# Patient Record
Sex: Female | Born: 1980
Health system: Southern US, Community
[De-identification: ages and names within clinical notes are randomized; demographics above are authoritative.]

## PROBLEM LIST (undated history)

## (undated) DIAGNOSIS — F419 Anxiety disorder, unspecified: Secondary | ICD-10-CM

## (undated) HISTORY — DX: Anxiety disorder, unspecified: F41.9

---
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
## 2002-02-16 ENCOUNTER — Inpatient Hospital Stay (HOSPITAL_COMMUNITY): Admission: AD | Admit: 2002-02-16 | Discharge: 2002-02-16 | Payer: Self-pay | Admitting: Family Medicine

## 2002-02-17 ENCOUNTER — Encounter: Payer: Self-pay | Admitting: Family Medicine

## 2002-03-03 ENCOUNTER — Other Ambulatory Visit: Admission: RE | Admit: 2002-03-03 | Discharge: 2002-03-03 | Payer: Self-pay | Admitting: Obstetrics and Gynecology

## 2002-07-14 ENCOUNTER — Encounter: Admission: RE | Admit: 2002-07-14 | Discharge: 2002-07-14 | Payer: Self-pay | Admitting: Obstetrics and Gynecology

## 2002-09-22 ENCOUNTER — Inpatient Hospital Stay (HOSPITAL_COMMUNITY): Admission: AD | Admit: 2002-09-22 | Discharge: 2002-09-22 | Payer: Self-pay | Admitting: Obstetrics and Gynecology

## 2002-09-23 ENCOUNTER — Inpatient Hospital Stay (HOSPITAL_COMMUNITY): Admission: AD | Admit: 2002-09-23 | Discharge: 2002-09-23 | Payer: Self-pay | Admitting: Obstetrics and Gynecology

## 2002-09-25 ENCOUNTER — Encounter: Payer: Self-pay | Admitting: Obstetrics and Gynecology

## 2002-09-25 ENCOUNTER — Inpatient Hospital Stay (HOSPITAL_COMMUNITY): Admission: AD | Admit: 2002-09-25 | Discharge: 2002-09-28 | Payer: Self-pay | Admitting: Obstetrics and Gynecology

## 2004-04-12 ENCOUNTER — Emergency Department (HOSPITAL_COMMUNITY): Admission: EM | Admit: 2004-04-12 | Discharge: 2004-04-12 | Payer: Self-pay | Admitting: Emergency Medicine

## 2013-02-25 ENCOUNTER — Ambulatory Visit (INDEPENDENT_AMBULATORY_CARE_PROVIDER_SITE_OTHER): Payer: Managed Care, Other (non HMO) | Admitting: Family Medicine

## 2013-02-25 VITALS — BP 110/68 | HR 109 | Temp 100.6°F | Resp 16 | Ht 65.0 in | Wt 155.0 lb

## 2013-02-25 DIAGNOSIS — R6889 Other general symptoms and signs: Secondary | ICD-10-CM

## 2013-02-25 DIAGNOSIS — R05 Cough: Secondary | ICD-10-CM

## 2013-02-25 DIAGNOSIS — R51 Headache: Secondary | ICD-10-CM

## 2013-02-25 DIAGNOSIS — R059 Cough, unspecified: Secondary | ICD-10-CM

## 2013-02-25 LAB — POCT INFLUENZA A/B
Influenza A, POC: NEGATIVE
Influenza B, POC: NEGATIVE

## 2013-02-25 MED ORDER — HYDROCODONE-HOMATROPINE 5-1.5 MG/5ML PO SYRP: 5.0000 mL | ORAL_SOLUTION | ORAL | Status: DC | PRN

## 2013-02-25 MED ORDER — OSELTAMIVIR PHOSPHATE 75 MG PO CAPS: 75.0000 mg | ORAL_CAPSULE | Freq: Two times a day (BID) | ORAL | Status: DC

## 2013-02-25 NOTE — Patient Instructions (Addendum)
Drink plenty of fluids  Get sufficient rest  Take Tamiflu one twice daily for 5 days  Use cough syrup 1 teaspoon every 4-6 hours as needed  Take an antihistamine decongestant such as Claritin-D, Allegra-D, or Zyrtec-D if the head is very congested.  Take tylenol or ibuprofen or aleve as needed for headache and body aches  Return if worse

## 2013-02-25 NOTE — Progress Notes (Signed)
Subjective: Yesterday evening patient developed chills, body aches, coughing, nasal congestion, generalized malaise. She took some NyQuil. Today she feels miserable. She is a little bit nauseous but no vomiting. Did not get a flu shot this year. Does have a headache  Objective: Ill-appearing lady curled up on the exam table. TMs normal. Throat clear. Neck supple without nodes. Chest clear. Heart regular without murmurs. He is a little bit tachycardic.  Assessment: Flulike illness  Plan: Fluids   Results for orders placed in visit on 02/25/13  POCT INFLUENZA A/B      Result Value Ref Range   Influenza A, POC Negative     Influenza B, POC Negative

## 2013-10-20 ENCOUNTER — Ambulatory Visit (INDEPENDENT_AMBULATORY_CARE_PROVIDER_SITE_OTHER): Payer: Managed Care, Other (non HMO) | Admitting: Family Medicine

## 2013-10-20 VITALS — BP 108/58 | HR 82 | Temp 98.6°F | Resp 16 | Ht 65.75 in | Wt 167.0 lb

## 2013-10-20 DIAGNOSIS — R05 Cough: Secondary | ICD-10-CM

## 2013-10-20 DIAGNOSIS — J029 Acute pharyngitis, unspecified: Secondary | ICD-10-CM

## 2013-10-20 DIAGNOSIS — B349 Viral infection, unspecified: Secondary | ICD-10-CM

## 2013-10-20 DIAGNOSIS — H109 Unspecified conjunctivitis: Secondary | ICD-10-CM

## 2013-10-20 DIAGNOSIS — R059 Cough, unspecified: Secondary | ICD-10-CM

## 2013-10-20 LAB — POCT RAPID STREP A (OFFICE): Rapid Strep A Screen: NEGATIVE

## 2013-10-20 MED ORDER — MAGIC MOUTHWASH W/LIDOCAINE: 5.0000 mL | Freq: Four times a day (QID) | ORAL | Status: DC | PRN

## 2013-10-20 MED ORDER — HYDROCODONE-HOMATROPINE 5-1.5 MG/5ML PO SYRP: 5.0000 mL | ORAL_SOLUTION | Freq: Three times a day (TID) | ORAL | Status: DC | PRN

## 2013-10-20 MED ORDER — AMOXICILLIN 500 MG PO CAPS: 500.0000 mg | ORAL_CAPSULE | Freq: Two times a day (BID) | ORAL | Status: DC

## 2013-10-20 NOTE — Progress Notes (Signed)
Chief Complaint:  Chief Complaint  Patient presents with  . Sore Throat    x 5 days  . Eye Burn    left since today     HPI: Terri Fry is a 33 y.o. female who is here for  A 5 day history of  Cough and sore throat. She can't sleep because of the sore throat. She is here today because it has been a long time and she is not better and then today she started having left eye redness. She noticed blisters on the back of her throat. 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 She denies fevers or chills or rash on the plasms of her hands or soles of her feet. She is not having any sinus sxs or ear pain.  No eye injury, + itching and burning  Past Medical History  Diagnosis Date  . Anxiety    History reviewed. No pertinent past surgical history. History   Social History  . Marital Status: Married    Spouse Name: N/A    Number of Children: N/A  . Years of Education: N/A   Social History Main Topics  . Smoking status: Never Smoker   . Smokeless tobacco: None  . Alcohol Use: None  . Drug Use: None  . Sexual Activity: None   Other Topics Concern  . None   Social History Narrative  . None   History reviewed. No pertinent family history. No Known Allergies Prior to Admission medications   Medication Sig Start Date End Date Taking? Authorizing Provider  escitalopram (LEXAPRO) 5 MG tablet Take 5 mg by mouth daily.   Yes Historical Provider, MD  HYDROcodone-homatropine (HYCODAN) 5-1.5 MG/5ML syrup Take 5 mLs by mouth every 4 (four) hours as needed for cough. 02/25/13   Peyton Najjar, MD  oseltamivir (TAMIFLU) 75 MG capsule Take 1 capsule (75 mg total) by mouth 2 (two) times daily. 02/25/13   Peyton Najjar, MD     ROS: The patient denies fevers, chills, night sweats, unintentional weight loss, chest pain, palpitations, wheezing, dyspnea on exertion, nausea, vomiting, abdominal pain, dysuria, hematuria, melena, numbness, weakness, or tingling.   All other systems have been  reviewed and were otherwise negative with the exception of those mentioned in the HPI and as above.    PHYSICAL EXAM: Filed Vitals:   10/20/13 2054  BP: 108/58  Pulse: 82  Temp: 98.6 F (37 C)  Resp: 16   Filed Vitals:   10/20/13 2054  Height: 5' 5.75" (1.67 m)  Weight: 167 lb (75.751 kg)   Body mass index is 27.16 kg/(m^2).  General: Alert, no acute distress HEENT:  Normocephalic, atraumatic, oropharynx patent. EOMI, PERRLA + erythematous vesicles. No exudates. TM nl, nontender sinuses Cardiovascular:  Regular rate and rhythm, no rubs murmurs or gallops.  No Carotid bruits, radial pulse intact. No pedal edema.  Respiratory: Clear to auscultation bilaterally.  No wheezes, rales, or rhonchi.  No cyanosis, no use of accessory musculature GI: No organomegaly, abdomen is soft and non-tender, positive bowel sounds.  No masses. Skin: No rashes. Neurologic: Facial musculature symmetric. Psychiatric: Patient is appropriate throughout our interaction. Lymphatic: No cervical lymphadenopathy Musculoskeletal: Gait intact.   LABS: Results for orders placed in visit on 10/20/13  POCT RAPID STREP A (OFFICE)      Result Value Ref Range   Rapid Strep A Screen Negative  Negative     EKG/XRAY:   Primary read interpreted by Dr. Conley Rolls at [facility].  ASSESSMENT/PLAN: Encounter Diagnoses  Name Primary?  . Acute pharyngitis, unspecified pharyngitis type Yes  . Conjunctivitis of left eye   . Viral illness   . Cough    Hycodan, magic mouthwash with lidocaine Rx  For amox givne but advise to wait until strep cx returns Zaditor for left eye conjunctival irritation  Gross sideeffects, risk and benefits, and alternatives of medications d/w patient. Patient is aware that all medications have potential sideeffects and we are unable to predict every sideeffect or drug-drug interaction that may occur.  Hamilton CapriLE, Laurenashley Viar PHUONG, DO 10/20/2013 10:43 PM

## 2013-10-20 NOTE — Patient Instructions (Signed)
Conjunctivitis °Conjunctivitis is commonly called "pink eye." Conjunctivitis can be caused by bacterial or viral infection, allergies, or injuries. There is usually redness of the lining of the eye, itching, discomfort, and sometimes discharge. There may be deposits of matter along the eyelids. A viral infection usually causes a watery discharge, while a bacterial infection causes a yellowish, thick discharge. Pink eye is very contagious and spreads by direct contact. °You may be given antibiotic eyedrops as part of your treatment. Before using your eye medicine, remove all drainage from the eye by washing gently with warm water and cotton balls. Continue to use the medication until you have awakened 2 mornings in a row without discharge from the eye. Do not rub your eye. This increases the irritation and helps spread infection. Use separate towels from other household members. Wash your hands with soap and water before and after touching your eyes. Use cold compresses to reduce pain and sunglasses to relieve irritation from light. Do not wear contact lenses or wear eye makeup until the infection is gone. °SEEK MEDICAL CARE IF:  °· Your symptoms are not better after 3 days of treatment. °· You have increased pain or trouble seeing. °· The outer eyelids become very red or swollen. °Document Released: 02/09/2004 Document Revised: 03/26/2011 Document Reviewed: 01/01/2005 °ExitCare® Patient Information ©2015 [employer]. This information is not intended to replace advice given to you by your health care provider. Make sure you discuss any questions you have with your health care provider. °Pharyngitis °Pharyngitis is redness, pain, and swelling (inflammation) of your pharynx.  °CAUSES  °Pharyngitis is usually caused by infection. Most of the time, these infections are from viruses (viral) and are part of a cold. However, sometimes pharyngitis is caused by bacteria (bacterial). Pharyngitis can also be caused by  allergies. Viral pharyngitis may be spread from person to person by coughing, sneezing, and personal items or utensils (cups, forks, spoons, toothbrushes). Bacterial pharyngitis may be spread from person to person by more intimate contact, such as kissing.  °SIGNS AND SYMPTOMS  °Symptoms of pharyngitis include:   °· Sore throat.   °· Tiredness (fatigue).   °· Low-grade fever.   °· Headache. °· Joint pain and muscle aches. °· Skin rashes. °· Swollen lymph nodes. °· Plaque-like film on throat or tonsils (often seen with bacterial pharyngitis). °DIAGNOSIS  °Your health care provider will ask you questions about your illness and your symptoms. Your medical history, along with a physical exam, is often all that is needed to diagnose pharyngitis. Sometimes, a rapid strep test is done. Other lab tests may also be done, depending on the suspected cause.  °TREATMENT  °Viral pharyngitis will usually get better in 3-4 days without the use of medicine. Bacterial pharyngitis is treated with medicines that kill germs (antibiotics).  °HOME CARE INSTRUCTIONS  °· Drink enough water and fluids to keep your urine clear or pale yellow.   °· Only take over-the-counter or prescription medicines as directed by your health care provider:   °¨ If you are prescribed antibiotics, make sure you finish them even if you start to feel better.   °¨ Do not take aspirin.   °· Get lots of rest.   °· Gargle with 8 oz of salt water (½ tsp of salt per 1 qt of water) as often as every 1-2 hours to soothe your throat.   °· Throat lozenges (if you are not at risk for choking) or sprays may be used to soothe your throat. °SEEK MEDICAL CARE IF:  °· You have large, tender lumps in   your neck. °· You have a rash. °· You cough up green, yellow-brown, or bloody spit. °SEEK IMMEDIATE MEDICAL CARE IF:  °· Your neck becomes stiff. °· You drool or are unable to swallow liquids. °· You vomit or are unable to keep medicines or liquids down. °· You have severe pain that  does not go away with the use of recommended medicines. °· You have trouble breathing (not caused by a stuffy nose). °MAKE SURE YOU:  °· Understand these instructions. °· Will watch your condition. °· Will get help right away if you are not doing well or get worse. °Document Released: 01/01/2005 Document Revised: 10/22/2012 Document Reviewed: 09/08/2012 °ExitCare® Patient Information ©2015 Exit[employer], LLC. This information is not intended to replace advice given to you by your health care provider. Make sure you discuss any questions you have with your health care provider. ° °

## 2013-10-23 LAB — CULTURE, GROUP A STREP: Organism ID, Bacteria: NORMAL

## 2013-11-06 ENCOUNTER — Telehealth: Payer: Self-pay | Admitting: Genetic Counselor

## 2013-11-06 NOTE — Telephone Encounter (Signed)
LEFT MESSAGE FOR PATIENT TO RETURN CALL TO SCHEDULE GENETIC APPT.  °

## 2013-11-06 NOTE — Telephone Encounter (Signed)
S/W PATIENT AND GAVE GENETIC APPT FOR 11/04 @ 1 Mariea ClontsW/KAREN Lowell GuitarPOWELL

## 2013-11-17 ENCOUNTER — Telehealth: Payer: Self-pay | Admitting: Genetic Counselor

## 2013-11-17 NOTE — Telephone Encounter (Signed)
Spoke with Terri Fry and confirmed that her mother has been tested for BRCA mutations.  She will bring report with her to the appointment. 

## 2013-11-18 ENCOUNTER — Other Ambulatory Visit: Payer: Managed Care, Other (non HMO)

## 2013-11-18 ENCOUNTER — Encounter: Payer: Self-pay | Admitting: Genetic Counselor

## 2013-11-18 ENCOUNTER — Ambulatory Visit (HOSPITAL_BASED_OUTPATIENT_CLINIC_OR_DEPARTMENT_OTHER): Payer: Managed Care, Other (non HMO) | Admitting: Genetic Counselor

## 2013-11-18 DIAGNOSIS — Z808 Family history of malignant neoplasm of other organs or systems: Secondary | ICD-10-CM

## 2013-11-18 DIAGNOSIS — Z8041 Family history of malignant neoplasm of ovary: Secondary | ICD-10-CM

## 2013-11-18 DIAGNOSIS — Z8049 Family history of malignant neoplasm of other genital organs: Secondary | ICD-10-CM

## 2013-11-18 DIAGNOSIS — Z800 Family history of malignant neoplasm of digestive organs: Secondary | ICD-10-CM

## 2013-11-18 DIAGNOSIS — Z8481 Family history of carrier of genetic disease: Secondary | ICD-10-CM

## 2013-11-18 DIAGNOSIS — Z315 Encounter for genetic counseling: Secondary | ICD-10-CM

## 2013-11-18 DIAGNOSIS — Z803 Family history of malignant neoplasm of breast: Secondary | ICD-10-CM

## 2013-11-18 NOTE — Progress Notes (Addendum)
Dr.  Lynnette Caffey, Jinny Blossom, DO requested a consultation for genetic counseling and risk assessment for Terri Fry, a 33 y.o. female, for discussion of her family history of melanoma and breast, ovarian, uterine and colon cancer as well as a known BRCA2 mutation found in her mother in 10/2013.  She presents to clinic today to discuss the possibility of a genetic predisposition to cancer, and to further clarify her risks, as well as her family members' risks for cancer.   HISTORY OF PRESENT ILLNESS: Terri Fry is a 33 y.o. female with no personal history of cancer.  She reporting having a 2 year history of breast leaking.  She states that it is milky in color and she notices in in her bra as well as when she is in the shower.  Her last pregnancy was 19 years ago.  She does SBE and has not noted any changes.  She has not had a mammogram, but is interested in getting one.  This GC suggested contacting her physician to let them know of the BRCA2 mutation in the family along with her breast leaking and ask if they would order a mammogram.  Past Medical History  Diagnosis Date  . Anxiety     History reviewed. No pertinent past surgical history.  History   Social History  . Marital Status: Married    Spouse Name: N/A    Number of Children: N/A  . Years of Education: N/A   Social History Main Topics  . Smoking status: Never Smoker   . Smokeless tobacco: None  . Alcohol Use: Yes  . Drug Use: None  . Sexual Activity: None   Other Topics Concern  . None   Social History Narrative    REPRODUCTIVE HISTORY AND PERSONAL RISK ASSESSMENT FACTORS: Menarche was at age 30.   premenopausal Uterus Intact: yes Ovaries Intact: yes G2P2A0, first live birth at age 40  She has not previously undergone treatment for infertility.   Oral Contraceptive use: 2 years   She has not used HRT in the past.    FAMILY HISTORY:  We obtained a detailed, 4-generation family history.  Significant diagnoses are  listed below: Family History  Problem Relation Age of Onset  . Endometrial cancer Mother 47    stage IV clear cell  . Colon cancer Maternal Grandmother 30  . Endometrial cancer Maternal Aunt 61  . Melanoma Other   . Breast cancer Other     dx in her 73s  . Ovarian cancer Other     dx in her 71s  . Stomach cancer Other    The patient's mother was tested through [facility] in October 2015, and was found to have a BRCA2 mutation.  Specifically [id number].    Patient's maternal ancestors are of [place] and [place] descent, and paternal ancestors are of [place] descent. There is no reported Ashkenazi Jewish ancestry. There is no known consanguinity.  GENETIC COUNSELING ASSESSMENT: Terri Fry is a 33 y.o. female with a family history of a known BRCA2 mutation as well as history of cancer including breast, ovarian, stomach, melanoma, and endometrial cancers which somewhat suggestive of a hereditary cancer syndrome and predisposition to cancer. We, therefore, discussed and recommended the following at today's visit.   DISCUSSION: We reviewed the characteristics, features and inheritance patterns of hereditary cancer syndromes. We also discussed genetic testing, including the appropriate family members to test, the process of testing, insurance coverage and turn-around-time for results.   We discussed that Jhoanna has  a 50% chance of having inherited the BRCA2 mutation found in her mother.  Genetic testing will allow [place] to know whether she does carry a mutation and therefore need to change her medical management.  We discussed that medical management changes include yearly mammograms and breast MRI's, and consideration of tamoxifen or oral contraceptive chemoprevention.  Alternatively, to decrease the risk for breast and ovarian cancer to the greatest extent, mastectomy and oophorectomy could be performed.   We discussed that we need a copy of her mother's actual report, as we have  the clinic note with the mutation.  The report allows [place] to determine that there was not a typo in the transcription of the letter, and provides consent from the original patient that their results may be used as a positive control.  We will draw blood today and get her sample preauthorized while we try to get a copy of the actual report from [facility].   PLAN: After considering the risks, benefits, and limitations, Terri Fry provided informed consent to pursue genetic testing and the blood sample will be sent to [facility] for analysis of the single site BRCA2 mutation. We discussed the implications of a positive, negative and/ or variant of uncertain significance genetic test result. Results should be available within approximately 10 days to 2 weeks' time, at which point they will be disclosed by telephone to Terri Fry, as will any additional recommendations warranted by these results. Terri Fry will receive a summary of her genetic counseling visit and a copy of her results once available. This information will also be available in Epic. We encouraged Terri Fry to remain in contact with cancer genetics annually so that we can continuously update the family history and inform her of any changes in cancer genetics and testing that may be of benefit for her family. Terri Fry's questions were answered to her satisfaction today. Our contact information was provided should additional questions or concerns arise.  The patient was seen for a total of 45 minutes, greater than 50% of which was spent face-to-face counseling.  This note will also be sent to the referring provider via the electronic medical record. The patient will be supplied with a summary of this genetic counseling discussion as well as educational information on the discussed hereditary cancer syndromes following the conclusion of their visit.    _______________________________________________________________________ For Office Staff:  Number of people involved in session: 1 Was an Intern/ student involved with case: no

## 2013-12-07 ENCOUNTER — Telehealth: Payer: Self-pay | Admitting: Genetic Counselor

## 2013-12-07 ENCOUNTER — Encounter: Payer: Self-pay | Admitting: Genetic Counselor

## 2013-12-07 DIAGNOSIS — Z1501 Genetic susceptibility to malignant neoplasm of breast: Secondary | ICD-10-CM | POA: Insufficient documentation

## 2013-12-07 DIAGNOSIS — Z1509 Genetic susceptibility to other malignant neoplasm: Secondary | ICD-10-CM

## 2013-12-07 NOTE — Telephone Encounter (Signed)
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  Scheduled a results appointment for Tuesday, 11/24 at 1 PM.

## 2013-12-08 ENCOUNTER — Telehealth: Payer: Self-pay | Admitting: *Deleted

## 2013-12-08 ENCOUNTER — Encounter: Payer: Self-pay | Admitting: Genetic Counselor

## 2013-12-08 ENCOUNTER — Telehealth: Payer: Self-pay | Admitting: Genetic Counselor

## 2013-12-08 ENCOUNTER — Ambulatory Visit (HOSPITAL_BASED_OUTPATIENT_CLINIC_OR_DEPARTMENT_OTHER): Payer: Managed Care, Other (non HMO) | Admitting: Genetic Counselor

## 2013-12-08 DIAGNOSIS — Z1501 Genetic susceptibility to malignant neoplasm of breast: Secondary | ICD-10-CM

## 2013-12-08 DIAGNOSIS — Z1509 Genetic susceptibility to other malignant neoplasm: Principal | ICD-10-CM

## 2013-12-08 DIAGNOSIS — Z315 Encounter for genetic counseling: Secondary | ICD-10-CM

## 2013-12-08 NOTE — Telephone Encounter (Signed)
Called Dr. Linda Hedges' office and spoke with Nurse Dorothea Ogle.  Explained that patient was found to be BRCA2 positive, and reported at original appointment that she has had a 2 year history of breast leakage.  I spoke with Dr. Lindi Adie about this, who feels that we should get her into the high risk breast clinic for evaluation and then a referral for mammogram and breast MRI.  Asked if Dr. Lynnette Caffey wanted to follow her for this or if she would want [place] to set up appointment in high risk clinic.  Dr. Lynnette Caffey is out this week, so Dorothea Ogle asked that we set up the appointment and get things started.

## 2013-12-08 NOTE — Telephone Encounter (Signed)
Received request from Maylon CosKaren Powell who spoke with Dr. Pamelia HoitGudena to schedule pt w/ him next week.  Called pt and confirmed 12/15/13 appt.  Placed a note for an intake form to be given to pt at time of check in.  Emailed Clydie BraunKaren to make her aware.

## 2013-12-08 NOTE — Progress Notes (Signed)
HPI: [name] was previously seen in the [facility] due to a known family mutation in [place] and family history of cancer and concerns regarding a hereditary predisposition to cancer. Please refer to our prior cancer genetics clinic note for more information regarding [name]'s medical, social and family histories, and our assessment and recommendations, at the time. [name]'s recent genetic test results were disclosed to her, as were recommendations warranted by these results. These results and recommendations are discussed in more detail below.  GENETIC TEST RESULTS: At the time of [name]'s visit, we recommended she pursue genetic testing of the BRCA2 targeted testing. This test, which included targeted sequencing of the BRCA2 gene.  The report date is 12/04/13.  Testing was performed at [facility]. Genetic testing revealed a deleterious BRCA2 mutation. The test report has been scanned into EPIC and is located under the Media tab.   The role of the BRCA2 gene in the body is to help regulate cell growth.  A mutation in a BRCA gene can allow cells to grow unchecked and thus the presence of this genetic alteration gives an explanation for why your mother and aunt developed uterine cancer, although uterine cancer is not a typical cancer seen in BRCA families.    MEDICAL MANAGEMENT, RISK-REDUCTION AND SURVEILLANCE:  Women who have an altered BRCA2 gene have an increased risk for both breast and ovarian cancer.  The good news is that there are ways of reducing one's risk of cancer.    We discussed to reduce the risk for breast cancer, prophylactic mastectomy (removal of both breasts) is the most effective option.  However, for women who choose to keep their breasts, we recommend yearly mammograms, yearly breast MRI, twice-yearly breast exams in a specialized breast center, and monthly self-breast exams.  Ms. Terri Fry reports having a two year  history of breast leakage.  Therefore we recommend getting a mammogram and breast MRI soon.  We will set Ms. Terri Fry up to be seen in the [facility] to obtain more information regarding the above options.   To reduce your risk for ovarian cancer, it is recommend you to have a prophylactic bilateral salpingo-oophorectomy (removal of your ovaries and fallopian tubes) once childbearing is complete.  Ms. Terri Fry has two sons ages 61 and 12.  She stated that she and her husband may consider another child. For women who are planning to have children, screening with CA-125 blood tests and transvaginal ultrasounds can be done twice per year.  However, these tests have not been shown to detect ovarian cancer at an early stage.  We reviewed who else in the family were at risk for having a BRCA2 mutation.  This includes her aunts, sister and children.  At this time one of Ms. Terri Fry aunts was negative for the BRCA2 mutation, and another is waiting for her results to come back.  CANCER SCREENING RECOMMENDATIONS: Ms. Terri Fry should have mammograms and breast MRI yearly.  She has been referred to the high risk breast clinic at [facility] to discuss these options.   RECOMMENDATIONS FOR FAMILY MEMBERS: Ms. Terri Fry's children are at 50% risk to have inherited the mutation. Her children, however, are relatively young and this will not be of any consequence to them for several years. We do not test children because there is no risk to them until they are adults.  It should also be kept in mind that for children, we are bound to know  a great deal more about breast cancer and its prevention in several years' time. We recommend that her children have genetic counseling and testing by the time that they are in their early 32s.    SUPPORT AND RESOURCES: There are two groups, Facing Our Risk (www.facingourrisk.com) and Bright Pink (www.brightpink.org) which some people have found useful.  They  provide opportunities to speak with other individuals from high-risk families.  To locate genetic counselors in other cities, visit the website of the [employer] (ArtistMovie.se) and Secretary/administrator for a Social worker by zip code.  Our contact number was provided. [name]'s questions were answered to her satisfaction, and she knows she is welcome to call us at anytime with additional questions or concerns.   Terri Kayser, MS, [facility] Certified Genetic Counselor [email or web address]

## 2013-12-09 ENCOUNTER — Encounter: Payer: Self-pay | Admitting: Genetic Counselor

## 2013-12-15 ENCOUNTER — Ambulatory Visit: Payer: Managed Care, Other (non HMO)

## 2013-12-15 ENCOUNTER — Telehealth: Payer: Self-pay | Admitting: Hematology and Oncology

## 2013-12-15 ENCOUNTER — Other Ambulatory Visit: Payer: Self-pay | Admitting: Hematology and Oncology

## 2013-12-15 ENCOUNTER — Encounter: Payer: Self-pay | Admitting: Hematology and Oncology

## 2013-12-15 ENCOUNTER — Ambulatory Visit (HOSPITAL_BASED_OUTPATIENT_CLINIC_OR_DEPARTMENT_OTHER): Payer: Managed Care, Other (non HMO) | Admitting: Hematology and Oncology

## 2013-12-15 DIAGNOSIS — Z1509 Genetic susceptibility to other malignant neoplasm: Principal | ICD-10-CM

## 2013-12-15 DIAGNOSIS — Z1501 Genetic susceptibility to malignant neoplasm of breast: Secondary | ICD-10-CM

## 2013-12-15 DIAGNOSIS — Z808 Family history of malignant neoplasm of other organs or systems: Secondary | ICD-10-CM

## 2013-12-15 DIAGNOSIS — N644 Mastodynia: Secondary | ICD-10-CM

## 2013-12-15 DIAGNOSIS — Z803 Family history of malignant neoplasm of breast: Secondary | ICD-10-CM

## 2013-12-15 NOTE — Progress Notes (Signed)
[place] CONSULT NOTE  No care team member to display  CHIEF COMPLAINTS/PURPOSE OF CONSULTATION:  BRCA2 mutation  HISTORY OF PRESENTING ILLNESS:  Terri Fry 33 y.o. female is here because of recent diagnosis of BRCA2 mutation positivity. Her mother was diagnosed with endometrial cancer. Subtype and had a BRCA2 mutation positivity and hence she came by to the genetics clinic to get tested. She was found to be BRCA2 mutation positive and requested an appointment to see [place] to discuss surveillance at high risk breast clinic. Patient reports that recently her left breast appears to be more tender as well as there appears to be some nipple discharge and leakage she is also complaining of lower abdominal pain in the pelvic area. She has not yet seen a gynecologist.  I reviewed her records extensively and collaborated the history with the patient.  In terms of breast cancer risk profile:  She menarched at early age of 55   She had 2 pregnancy, her first child was born at age 54  She as received birth control pills for approximately one year.  She was never exposed to fertility medications or hormone replacement therapy.  She has  family history of Breast/GYN/GI cancer Her mother has endometrial cancer. Grandmother breast cancer and ovarian cancers  MEDICAL HISTORY:  Past Medical History  Diagnosis Date  . Anxiety     SURGICAL HISTORY: No past surgical history on file.  SOCIAL HISTORY: History   Social History  . Marital Status: Married    Spouse Name: Cory Roughen    Number of Children: 2  . Years of Education: N/A   Occupational History  . Not on file.   Social History Main Topics  . Smoking status: Never Smoker   . Smokeless tobacco: Not on file  . Alcohol Use: Yes  . Drug Use: Not on file  . Sexual Activity: Not on file   Other Topics Concern  . Not on file   Social History Narrative    FAMILY HISTORY: Family History  Problem Relation Age of Onset  .  Endometrial cancer Mother 85    stage IV clear cell  . BRCA 1/2 Mother     BRCA2 positive  . Colon cancer Maternal Grandmother 28  . Endometrial cancer Maternal Aunt 61  . Melanoma Other   . Breast cancer Other     dx in her 43s  . Ovarian cancer Other     dx in her 60s  . Stomach cancer Other   . BRCA 1/2 Maternal Aunt     BRCA2 negative    ALLERGIES:  has No Known Allergies.  MEDICATIONS:  Current Outpatient Prescriptions  Medication Sig Dispense Refill  . Alum & Mag Hydroxide-Simeth (MAGIC MOUTHWASH W/LIDOCAINE) SOLN Take 5 mLs by mouth 4 (four) times daily as needed for mouth pain. 120 mL 0  . amoxicillin (AMOXIL) 500 MG capsule Take 1 capsule (500 mg total) by mouth 2 (two) times daily. 20 capsule 0  . escitalopram (LEXAPRO) 5 MG tablet Take 5 mg by mouth daily.    Marland Kitchen HYDROcodone-homatropine (HYCODAN) 5-1.5 MG/5ML syrup Take 5 mLs by mouth every 8 (eight) hours as needed for cough. 120 mL 0   No current facility-administered medications for this visit.    REVIEW OF SYSTEMS:   Constitutional: Denies fevers, chills or abnormal night sweats Eyes: Denies blurriness of vision, double vision or watery eyes Ears, nose, mouth, throat, and face: Denies mucositis or sore throat Respiratory: Denies cough, dyspnea or wheezes  Cardiovascular: Denies palpitation, chest discomfort or lower extremity swelling Gastrointestinal:  Denies nausea, heartburn or change in bowel habits Skin: Denies abnormal skin rashes Lymphatics: Denies new lymphadenopathy or easy bruising Neurological:Denies numbness, tingling or new weaknesses Behavioral/Psych: Mood is stable, no new changes  Breast:  Denies any palpable lumps complaints of nipple discharge in left breast tenderness All other systems were reviewed with the patient and are negative.  PHYSICAL EXAMINATION: ECOG PERFORMANCE STATUS: 1 - Symptomatic but completely ambulatory  Filed Vitals:   12/15/13 1256  BP: 120/71  Pulse: 70  Temp: 98.5  F (36.9 C)  Resp: 18   Filed Weights   12/15/13 1256  Weight: 167 lb 8 oz (75.978 kg)    GENERAL:alert, no distress and comfortable SKIN: skin color, texture, turgor are normal, no rashes or significant lesions EYES: normal, conjunctiva are pink and non-injected, sclera clear OROPHARYNX:no exudate, no erythema and lips, buccal mucosa, and tongue normal  NECK: supple, thyroid normal size, non-tender, without nodularity LYMPH:  no palpable lymphadenopathy in the cervical, axillary or inguinal LUNGS: clear to auscultation and percussion with normal breathing effort HEART: regular rate & rhythm and no murmurs and no lower extremity edema ABDOMEN:abdomen soft, non-tender and normal bowel sounds Musculoskeletal:no cyanosis of digits and no clubbing  PSYCH: alert & oriented x 3 with fluent speech NEURO: no focal motor/sensory deficits BREAST: No palpable nodules in breast. No palpable axillary or supraclavicular lymphadenopathy  LABORATORY DATA:  I have reviewed the data as listed No results found for: WBC, HGB, HCT, MCV, PLT No results found for: NA, K, CL, CO2  RADIOGRAPHIC STUDIES: I have personally reviewed the radiological reports and agreed with the findings in the report.  ASSESSMENT AND PLAN:  BRCA2 positive BRCA2 mutation positive: I discussed with her the BRCA2 mutation positivity increases the risk of breast cancer and ovarian cancer. The incidence of breast cancer is about 45% and that of ovarian cancer is 11-17%. I discussed different ways to do surveillance for breast cancer and I recommended doing annual mammograms and breast MRIs. Patient also complains of tenderness or nipple leakage and discharge and hence we need to urgently order these tests. Regarding ovarian cancer risk reduction, since there is no dilated test that can help [place], I do not recommend testing for CA 125. Given the current symptoms of lower abdominal pain, I recommended obtaining a CT abdomen and pelvis  for further evaluation. I instructed the patient to urgently make an appointment to see gynecologist for pelvic exam and ovarian evaluation. Also did discuss the risks and benefits of oophorectomy. Patient is very concerned about menopausal symptoms of oophorectomy. I discussed with her that a short duration of hormone replacement therapy is not an unreasonable approach.  I discussed different remedies for management of hot flashes and mood changes including Neurontin and Effexor. The patient is already on Lexapro. I discussed with her that Lexapro may help with hot flashes as well.  I would like to see her back in 3 weeks for discussion regarding the tests and talk about additional interventions.     Rulon Eisenmenger, MD 12/15/2013 2:04 PM

## 2013-12-15 NOTE — Progress Notes (Signed)
New patient intake form - chart updated.  Sent to scan.   

## 2013-12-15 NOTE — Addendum Note (Signed)
Addended by: Lorri FrederickFRANKLIN, Montravious Weigelt K on: 12/15/2013 05:46 PM   Modules accepted: Orders, Medications

## 2013-12-15 NOTE — Assessment & Plan Note (Signed)
BRCA2 mutation positive: I discussed with her the BRCA2 mutation positivity increases the risk of breast cancer and ovarian cancer. The incidence of breast cancer is about 45% and that of ovarian cancer is 11-17%. I discussed different ways to do surveillance for breast cancer and I recommended doing annual mammograms and breast MRIs. Patient also complains of tenderness or nipple leakage and discharge and hence we need to urgently order these tests. Regarding ovarian cancer risk reduction, since there is no dilated test that can help us, I do not recommend testing for CA 125. Given the current symptoms of lower abdominal pain, I recommended obtaining a CT abdomen and pelvis for further evaluation. I instructed the patient to urgently make an appointment to see gynecologist for pelvic exam and ovarian evaluation. Also did discuss the risks and benefits of oophorectomy. Patient is very concerned about menopausal symptoms of oophorectomy. I discussed with her that a short duration of hormone replacement therapy is not an unreasonable approach.  I discussed different remedies for management of hot flashes and mood changes including Neurontin and Effexor. The patient is already on Lexapro. I discussed with her that Lexapro may help with hot flashes as well.  I would like to see her back in 3 weeks for discussion regarding the tests and talk about additional interventions. 

## 2013-12-15 NOTE — Telephone Encounter (Signed)
gv pt appt schedule for dec and appts for ct, mammo and mri. pt also given prep for ct. all appts on appt desk.

## 2013-12-17 ENCOUNTER — Ambulatory Visit
Admission: RE | Admit: 2013-12-17 | Discharge: 2013-12-17 | Disposition: A | Discharge status: Home / Self Care | Payer: Managed Care, Other (non HMO) | Source: Ambulatory Visit | Attending: Hematology and Oncology | Admitting: Hematology and Oncology

## 2013-12-17 ENCOUNTER — Encounter: Payer: Self-pay | Admitting: Genetic Counselor

## 2013-12-17 ENCOUNTER — Other Ambulatory Visit: Payer: Self-pay | Admitting: Oncology

## 2013-12-17 DIAGNOSIS — Z1501 Genetic susceptibility to malignant neoplasm of breast: Secondary | ICD-10-CM

## 2013-12-17 DIAGNOSIS — Z1509 Genetic susceptibility to other malignant neoplasm: Principal | ICD-10-CM

## 2013-12-17 DIAGNOSIS — Z1379 Encounter for other screening for genetic and chromosomal anomalies: Secondary | ICD-10-CM | POA: Insufficient documentation

## 2013-12-17 MED ORDER — IOHEXOL 300 MG/ML  SOLN
100.0000 mL | Freq: Once | INTRAMUSCULAR | Status: AC | PRN
  Administered 2013-12-17: 100 mL via INTRAVENOUS

## 2013-12-23 ENCOUNTER — Other Ambulatory Visit: Payer: Self-pay

## 2013-12-23 ENCOUNTER — Other Ambulatory Visit: Payer: Self-pay | Admitting: Hematology and Oncology

## 2013-12-23 DIAGNOSIS — N644 Mastodynia: Secondary | ICD-10-CM

## 2013-12-25 ENCOUNTER — Ambulatory Visit
Admission: RE | Admit: 2013-12-25 | Discharge: 2013-12-25 | Disposition: A | Discharge status: Home / Self Care | Payer: Managed Care, Other (non HMO) | Source: Ambulatory Visit | Attending: Hematology and Oncology | Admitting: Hematology and Oncology

## 2013-12-25 DIAGNOSIS — Z1501 Genetic susceptibility to malignant neoplasm of breast: Secondary | ICD-10-CM

## 2013-12-25 DIAGNOSIS — Z1509 Genetic susceptibility to other malignant neoplasm: Principal | ICD-10-CM

## 2013-12-25 DIAGNOSIS — N644 Mastodynia: Secondary | ICD-10-CM

## 2014-01-05 ENCOUNTER — Telehealth: Payer: Self-pay | Admitting: Hematology and Oncology

## 2014-01-05 ENCOUNTER — Ambulatory Visit (HOSPITAL_BASED_OUTPATIENT_CLINIC_OR_DEPARTMENT_OTHER): Payer: Managed Care, Other (non HMO) | Admitting: Hematology and Oncology

## 2014-01-05 VITALS — BP 123/56 | HR 79 | Temp 98.3°F | Resp 20 | Ht 60.75 in | Wt 172.0 lb

## 2014-01-05 DIAGNOSIS — Z1501 Genetic susceptibility to malignant neoplasm of breast: Secondary | ICD-10-CM

## 2014-01-05 DIAGNOSIS — Z1509 Genetic susceptibility to other malignant neoplasm: Principal | ICD-10-CM

## 2014-01-05 NOTE — Telephone Encounter (Signed)
, °

## 2014-01-05 NOTE — Assessment & Plan Note (Signed)
BRCA2 [id number] mutation:  Breast cancer surveillance: 1. Breast exam 12/15/2013 normal 2. Mammograms and breast MRI 12/25/2013 and 12/28/2013 normal  Ovarian cancer surveillance: 1. GYN evaluations 2. CT abdomen and pelvis 12/18/1998 1510 for lower abdominal pain: Negative for cancer  3. Patient is evaluating different options including oophorectomy and will discuss this with her gynecologist.  Return to clinic once a year with mammograms and MRI breast and follow-up   

## 2014-01-05 NOTE — Progress Notes (Signed)
No care team member to display  DIAGNOSIS: BRCA2 mutation high-risk breast cancer CHIEF COMPLIANT: Follow-up after scans.  INTERVAL HISTORY: Terri Fry is a 33 year old lady with above-mentioned history of BRCA2 mutation she is here for follow-up after undergoing MRIs mammograms and CT of the abdomen and pelvis. She denies any other new complaints or problems.  REVIEW OF SYSTEMS:   Constitutional: Denies fevers, chills or abnormal weight loss Eyes: Denies blurriness of vision Ears, nose, mouth, throat, and face: Denies mucositis or sore throat Respiratory: Denies cough, dyspnea or wheezes Cardiovascular: Denies palpitation, chest discomfort or lower extremity swelling Gastrointestinal:  Denies nausea, heartburn or change in bowel habits Skin: Denies abnormal skin rashes Lymphatics: Denies new lymphadenopathy or easy bruising Neurological:Denies numbness, tingling or new weaknesses Behavioral/Psych: Mood is stable, no new changes  Breast: Small amount of nipple discharge All other systems were reviewed with the patient and are negative.  I have reviewed the past medical history, past surgical history, social history and family history with the patient and they are unchanged from previous note.  ALLERGIES:  has No Known Allergies.  MEDICATIONS:  Current Outpatient Prescriptions  Medication Sig Dispense Refill  . escitalopram (LEXAPRO) 5 MG tablet Take 5 mg by mouth daily.     No current facility-administered medications for this visit.    PHYSICAL EXAMINATION: ECOG PERFORMANCE STATUS: 1 - Symptomatic but completely ambulatory  Filed Vitals:   01/05/14 0834  BP: 123/56  Pulse: 79  Temp: 98.3 F (36.8 C)  Resp: 20   Filed Weights   01/05/14 0834  Weight: 172 lb (78.019 kg)    GENERAL:alert, no distress and comfortable SKIN: skin color, texture, turgor are normal, no rashes or significant lesions EYES: normal, Conjunctiva are pink and non-injected, sclera  clear OROPHARYNX:no exudate, no erythema and lips, buccal mucosa, and tongue normal  NECK: supple, thyroid normal size, non-tender, without nodularity LYMPH:  no palpable lymphadenopathy in the cervical, axillary or inguinal LUNGS: clear to auscultation and percussion with normal breathing effort HEART: regular rate & rhythm and no murmurs and no lower extremity edema ABDOMEN:abdomen soft, non-tender and normal bowel sounds Musculoskeletal:no cyanosis of digits and no clubbing  NEURO: alert & oriented x 3 with fluent speech, no focal motor/sensory deficits  LABORATORY DATA:  I have reviewed the data as listed   Chemistry   No results found for: NA, K, CL, CO2, BUN, CREATININE, GLU No results found for: CALCIUM, ALKPHOS, AST, ALT, BILITOT   RADIOGRAPHIC STUDIES: I have personally reviewed the radiology reports and agreed with their findings. MRI breasts, mammogram breasts, CT abdomen and pelvis were reviewed   ASSESSMENT & PLAN:  BRCA2 positive BRCA2 [id number] mutation:  Breast cancer surveillance: 1. Breast exam 12/15/2013 normal 2. Mammograms and breast MRI 12/25/2013 and 12/28/2013 normal  Ovarian cancer surveillance: 1. GYN evaluations 2. CT abdomen and pelvis 12/18/1998 1510 for lower abdominal pain: Negative for cancer  3. Patient is evaluating different options including oophorectomy and will discuss this with her gynecologist.  Return to clinic once a year with mammograms and MRI breast and follow-up     Orders Placed This Encounter  Procedures  . MR Breast Bilateral W Wo Contrast    Standing Status: Future     Number of Occurrences:      Standing Expiration Date: 03/09/2015    Order Specific Question:  Reason for Exam (SYMPTOM  OR DIAGNOSIS REQUIRED)    Answer:  BRCA 2 mutation    Order Specific Question:  Preferred imaging location?  Answer:  [facility]    Order Specific Question:  Does the patient have a pacemaker or implanted devices?     Answer:  No    Order Specific Question:  What is the patient's sedation requirement?    Answer:  No Sedation   The patient has a good understanding of the overall plan. she agrees with it. She will call with any problems that may develop before her next visit here.   Rulon Eisenmenger, MD 01/05/2014 9:20 AM

## 2014-12-13 ENCOUNTER — Other Ambulatory Visit: Payer: Self-pay

## 2014-12-13 DIAGNOSIS — Z1231 Encounter for screening mammogram for malignant neoplasm of breast: Secondary | ICD-10-CM

## 2015-01-06 ENCOUNTER — Encounter: Payer: Self-pay | Admitting: Hematology and Oncology

## 2015-01-06 ENCOUNTER — Telehealth: Payer: Self-pay | Admitting: Hematology and Oncology

## 2015-01-06 ENCOUNTER — Ambulatory Visit (HOSPITAL_BASED_OUTPATIENT_CLINIC_OR_DEPARTMENT_OTHER): Payer: Managed Care, Other (non HMO) | Admitting: Hematology and Oncology

## 2015-01-06 VITALS — BP 114/70 | HR 87 | Temp 100.0°F | Resp 18 | Wt 159.6 lb

## 2015-01-06 DIAGNOSIS — Z1501 Genetic susceptibility to malignant neoplasm of breast: Secondary | ICD-10-CM

## 2015-01-06 DIAGNOSIS — Z1509 Genetic susceptibility to other malignant neoplasm: Principal | ICD-10-CM

## 2015-01-06 DIAGNOSIS — Z1371 Encounter for nonprocreative screening for genetic disease carrier status: Secondary | ICD-10-CM | POA: Diagnosis not present

## 2015-01-06 NOTE — Assessment & Plan Note (Signed)
BRCA2 [id number] mutation:  Breast cancer surveillance: 1. Breast exam 01/06/2015 normal 2. Mammograms and breast MRI 12/25/2013 and 12/28/2013 normal (2016 scans are scheduled)  Ovarian cancer surveillance: 1. GYN evaluations 2. CT abdomen and pelvis 12/17/2013 for lower abdominal pain: Negative for cancer  3. Patient is evaluating different options including oophorectomy and will discuss this with her gynecologist.  Return to clinic once a year with mammograms and MRI breast and follow-up

## 2015-01-06 NOTE — Telephone Encounter (Signed)
Patient did not call for her mri as we had left her a message to call in 12/2013 for her appointment,we do not have any 2018 schedules open at this time and she is aware   anne

## 2015-01-06 NOTE — Progress Notes (Signed)
Patient Care Team: No Pcp Per Patient as PCP - General (General Practice)  DIAGNOSIS: BRCA2 mutation.  CHIEF COMPLIANT: surveillance for breast cancer  INTERVAL HISTORY: Terri Fry is a 34 year old with above-mentioned history of BRCA2 mutation who is here for annual follow-up. She reports no problems or concerns. She denies any lumps or nodules in the breasts. She is scheduled to undergo mammogram next week but her breast MRI has not been scheduled.  REVIEW OF SYSTEMS:   Constitutional: Denies fevers, chills or abnormal weight loss Eyes: Denies blurriness of vision Ears, nose, mouth, throat, and face: Denies mucositis or sore throat Respiratory: Denies cough, dyspnea or wheezes Cardiovascular: Denies palpitation, chest discomfort Gastrointestinal:  Denies nausea, heartburn or change in bowel habits Skin: Denies abnormal skin rashes Lymphatics: Denies new lymphadenopathy or easy bruising Neurological:Denies numbness, tingling or new weaknesses Behavioral/Psych: Mood is stable, no new changes  Extremities: No lower extremity edema Breast:  denies any pain or lumps or nodules in either breasts All other systems were reviewed with the patient and are negative.  I have reviewed the past medical history, past surgical history, social history and family history with the patient and they are unchanged from previous note.  ALLERGIES:  has No Known Allergies.  MEDICATIONS:  No current outpatient prescriptions on file.   No current facility-administered medications for this visit.    PHYSICAL EXAMINATION: ECOG PERFORMANCE STATUS: 0 - Asymptomatic  Filed Vitals:   01/06/15 1008  BP: 114/70  Pulse: 87  Temp: 100 F (37.8 C)  Resp: 18   Filed Weights   01/06/15 1008  Weight: 159 lb 9.6 oz (72.394 kg)    GENERAL:alert, no distress and comfortable SKIN: skin color, texture, turgor are normal, no rashes or significant lesions EYES: normal, Conjunctiva are pink and  non-injected, sclera clear OROPHARYNX:no exudate, no erythema and lips, buccal mucosa, and tongue normal  NECK: supple, thyroid normal size, non-tender, without nodularity LYMPH:  no palpable lymphadenopathy in the cervical, axillary or inguinal LUNGS: clear to auscultation and percussion with normal breathing effort HEART: regular rate & rhythm and no murmurs and no lower extremity edema ABDOMEN:abdomen soft, non-tender and normal bowel sounds MUSCULOSKELETAL:no cyanosis of digits and no clubbing  NEURO: alert & oriented x 3 with fluent speech, no focal motor/sensory deficits EXTREMITIES: No lower extremity edema BREAST: No palpable masses or nodules in either right or left breasts. No palpable axillary supraclavicular or infraclavicular adenopathy no breast tenderness or nipple discharge. (exam performed in the presence of a chaperone)  ASSESSMENT & PLAN:  BRCA2 positive BRCA2 [id number] mutation:  Breast cancer surveillance: 1. Breast exam 01/06/2015 normal 2. Mammograms and breast MRI 12/25/2013 and 12/28/2013 normal (2016 scans Need to be done) I recommended annual mammograms and breast MRIs.  Ovarian cancer surveillance: 1. GYN evaluations 2. CT abdomen and pelvis 12/17/2013 for lower abdominal pain: Negative for cancer  3. Patient is evaluating different options including oophorectomy and will discuss this with her gynecologist. She has plans to undergo oophorectomy when she is 34 years old.  Return to clinic once a year with mammograms and MRI breast and follow-up   Orders Placed This Encounter  Procedures  . MM Digital Diagnostic Bilat    Standing Status: Future     Number of Occurrences:      Standing Expiration Date: 01/05/2018    Order Specific Question:  Reason for Exam (SYMPTOM  OR DIAGNOSIS REQUIRED)    Answer:  BRCA 2 mutation    Order Specific Question:  Is the patient pregnant?    Answer:  No    Order Specific Question:  Preferred imaging location?     Answer:  [facility] [place]  . MR Breast Bilateral W Wo Contrast    Standing Status: Future     Number of Occurrences:      Standing Expiration Date: 03/08/2019    Order Specific Question:  If indicated for the ordered procedure, I authorize the administration of contrast media per Radiology protocol    Answer:  Yes    Order Specific Question:  Reason for Exam (SYMPTOM  OR DIAGNOSIS REQUIRED)    Answer:  BRCA 2 mutation. Annual eval    Order Specific Question:  Preferred imaging location?    Answer:  [facility]    Order Specific Question:  Does the patient have a pacemaker or implanted devices?    Answer:  No    Order Specific Question:  What is the patient's sedation requirement?    Answer:  No Sedation   The patient has a good understanding of the overall plan. she agrees with it. she will call with any problems that may develop before the next visit here.   Rulon Eisenmenger, MD 01/06/2015

## 2015-01-07 ENCOUNTER — Ambulatory Visit
Admission: RE | Admit: 2015-01-07 | Discharge: 2015-01-07 | Disposition: A | Discharge status: Home / Self Care | Payer: Managed Care, Other (non HMO) | Source: Ambulatory Visit

## 2015-01-07 DIAGNOSIS — Z1231 Encounter for screening mammogram for malignant neoplasm of breast: Secondary | ICD-10-CM

## 2015-01-14 ENCOUNTER — Ambulatory Visit: Payer: Managed Care, Other (non HMO)

## 2015-01-20 ENCOUNTER — Ambulatory Visit
Admission: RE | Admit: 2015-01-20 | Discharge: 2015-01-20 | Disposition: A | Discharge status: Home / Self Care | Payer: Managed Care, Other (non HMO) | Source: Ambulatory Visit | Attending: Hematology and Oncology | Admitting: Hematology and Oncology

## 2015-01-20 DIAGNOSIS — Z1501 Genetic susceptibility to malignant neoplasm of breast: Secondary | ICD-10-CM

## 2015-01-20 DIAGNOSIS — Z1509 Genetic susceptibility to other malignant neoplasm: Principal | ICD-10-CM

## 2015-02-14 ENCOUNTER — Ambulatory Visit
Admission: RE | Admit: 2015-02-14 | Discharge: 2015-02-14 | Disposition: A | Discharge status: Home / Self Care | Payer: Managed Care, Other (non HMO) | Source: Ambulatory Visit | Attending: Hematology and Oncology | Admitting: Hematology and Oncology

## 2015-02-14 MED ORDER — GADOBENATE DIMEGLUMINE 529 MG/ML IV SOLN
14.0000 mL | Freq: Once | INTRAVENOUS | Status: AC | PRN
  Administered 2015-02-14: 14 mL via INTRAVENOUS

## 2017-06-25 ENCOUNTER — Ambulatory Visit: Payer: Self-pay | Admitting: Nurse Practitioner

## 2017-06-25 VITALS — BP 95/65 | HR 63 | Temp 97.6°F | Resp 16 | Wt 160.2 lb

## 2017-06-25 DIAGNOSIS — Z0000 Encounter for general adult medical examination without abnormal findings: Secondary | ICD-10-CM

## 2017-06-25 NOTE — Progress Notes (Signed)
Subjective:  Terri Fry is a 37 y.o. female who presents for basic physical exam.  Patient is here for a health assessment as a requirement for [place] to keep her insurance premiums low.  Patient denies any current health related concerns.  The patient denies any past medical history, and does not take any medications on a daily basis.  The patient denies any allergies to any foods or medications.  The patient's last menstrual period was last week, and they are regular.    Reviewed the patient's past medical history, current medications, and allergies.  The patient denies any surgical history other than a C-section. Patient does have a family medical history of cancer heart disease hypertension and stroke.   The patient lives at home with her husband and 3 children.  The patient denies any use of recreational drugs and does not smoke.  The patient does admit to drinking socially.   Past Medical History:  Diagnosis Date  . Anxiety     Past Surgical History:  Procedure Laterality Date  . CESAREAN SECTION      Social History   Tobacco Use  . Smoking status: Never Smoker  Substance Use Topics  . Alcohol use: Yes  . Drug use: No    No Known Allergies  No current outpatient medications on file.   No current facility-administered medications for this visit.     Review of Systems  Constitutional: Negative.   HENT: Negative.   Eyes: Negative.   Respiratory: Negative.   Cardiovascular: Negative.   Gastrointestinal: Negative.   Genitourinary: Negative.   Musculoskeletal: Negative.   Skin: Negative.   Neurological: Negative.   Endo/Heme/Allergies: Negative.   Psychiatric/Behavioral: Negative.      Objective:  BP 95/65 (BP Location: Right Arm, Patient Position: Sitting, Cuff Size: Normal)   Pulse 63   Temp 97.6 F (36.4 C) (Oral)   Resp 16   Wt 160 lb 3.2 oz (72.7 kg)   SpO2 98%   BMI 30.52 kg/m   General Appearance:  Alert, cooperative, no distress, appears  stated age  Head:  Normocephalic, without obvious abnormality, atraumatic  Eyes:  PERRL, conjunctiva/corneas clear, EOM's intact, fundi benign, both eyes  Ears:  Normal TM's and external ear canals, both ears  Nose: Nares normal, septum midline,mucosa normal, no drainage or sinus tenderness  Throat: Lips, mucosa, and tongue normal; teeth and gums normal  Neck: Supple, symmetrical, trachea midline, no adenopathy;  thyroid: not enlarged, symmetric, no tenderness/mass/nodules; no carotid bruit or JVD  Back:   Symmetric, no curvature, ROM normal, no CVA tenderness  Lungs:   Clear to auscultation bilaterally, respirations unlabored  Breasts:  Deferred  Heart:  Regular rate and rhythm, S1 and S2 normal, no murmur, rub, or gallop  Abdomen:   Soft, non-tender, bowel sounds active all four quadrants,  no masses, no organomegaly  Pelvic: Deferred  Extremities: Extremities normal, atraumatic, no cyanosis or edema  Pulses: 2+ and symmetric  Skin: Skin color, texture, turgor normal, no rashes or lesions  Lymph nodes: Cervical, supraclavicular nodes normal  Neurologic: Normal      Assessment:  basic physical exam    Plan:  Patient education provided.  No labs needed at this time.  Patient education provided for health maintenance and health prevention for her age group.  The patient does not have a PCP currently, so the number for the patient engagement center was provided.  The patient was instructed that she will need to follow-up with the PCP for  additional lab work, EKGs, mammograms etc. or further testing.  The patient verbalizes understanding and has no questions at time of discharge.

## 2017-06-25 NOTE — Patient Instructions (Signed)
Health Maintenance, Female  Patient Terri Fry [phone] OR [phone] Adopting a healthy lifestyle and getting preventive care can go a long way to promote health and wellness. Talk with your health care provider about what schedule of regular examinations is right for you. This is a good chance for you to check in with your provider about disease prevention and staying healthy. In between checkups, there are plenty of things you can do on your own. Experts have done a lot of research about which lifestyle changes and preventive measures are most likely to keep you healthy. Ask your health care provider for more information. Weight and diet Eat a healthy diet  Be sure to include plenty of vegetables, fruits, low-fat dairy products, and lean protein.  Do not eat a lot of foods high in solid fats, added sugars, or salt.  Get regular exercise. This is one of the most important things you can do for your health. ? Most adults should exercise for at least 150 minutes each week. The exercise should increase your heart rate and make you sweat (moderate-intensity exercise). ? Most adults should also do strengthening exercises at least twice a week. This is in addition to the moderate-intensity exercise.  Maintain a healthy weight  Body mass index (BMI) is a measurement that can be used to identify possible weight problems. It estimates body fat based on height and weight. Your health care provider can help determine your BMI and help you achieve or maintain a healthy weight.  For females 29 years of age and older: ? A BMI below 18.5 is considered underweight. ? A BMI of 18.5 to 24.9 is normal. ? A BMI of 25 to 29.9 is considered overweight. ? A BMI of 30 and above is considered obese.  Watch levels of cholesterol and blood lipids  You should start having your blood tested for lipids and cholesterol at 36 years of age, then have this test every 5 years.  You may need to have your  cholesterol levels checked more often if: ? Your lipid or cholesterol levels are high. ? You are older than 36 years of age. ? You are at high risk for heart disease.  Cancer screening Lung Cancer  Lung cancer screening is recommended for adults 41-83 years old who are at high risk for lung cancer because of a history of smoking.  A yearly low-dose CT scan of the lungs is recommended for people who: ? Currently smoke. ? Have quit within the past 15 years. ? Have at least a 30-pack-year history of smoking. A pack year is smoking an average of one pack of cigarettes a day for 1 year.  Yearly screening should continue until it has been 15 years since you quit.  Yearly screening should stop if you develop a health problem that would prevent you from having lung cancer treatment.  Breast Cancer  Practice breast self-awareness. This means understanding how your breasts normally appear and feel.  It also means doing regular breast self-exams. Let your health care provider know about any changes, no matter how small.  If you are in your 20s or 30s, you should have a clinical breast exam (CBE) by a health care provider every 1-3 years as part of a regular health exam.  If you are 47 or older, have a CBE every year. Also consider having a breast X-ray (mammogram) every year.  If you have a family history of breast cancer, talk to your health care provider about genetic screening.  If you are at high risk for breast cancer, talk to your health care provider about having an MRI and a mammogram every year.  Breast cancer gene (BRCA) assessment is recommended for women who have family members with BRCA-related cancers. BRCA-related cancers include: ? Breast. ? Ovarian. ? Tubal. ? Peritoneal cancers.  Results of the assessment will determine the need for genetic counseling and BRCA1 and BRCA2 testing.  Cervical Cancer Your health care provider may recommend that you be screened regularly  for cancer of the pelvic organs (ovaries, uterus, and vagina). This screening involves a pelvic examination, including checking for microscopic changes to the surface of your cervix (Pap test). You may be encouraged to have this screening done every 3 years, beginning at age 86.  For women ages 64-65, health care providers may recommend pelvic exams and Pap testing every 3 years, or they may recommend the Pap and pelvic exam, combined with testing for human papilloma virus (HPV), every 5 years. Some types of HPV increase your risk of cervical cancer. Testing for HPV may also be done on women of any age with unclear Pap test results.  Other health care providers may not recommend any screening for nonpregnant women who are considered low risk for pelvic cancer and who do not have symptoms. Ask your health care provider if a screening pelvic exam is right for you.  If you have had past treatment for cervical cancer or a condition that could lead to cancer, you need Pap tests and screening for cancer for at least 20 years after your treatment. If Pap tests have been discontinued, your risk factors (such as having a new sexual partner) need to be reassessed to determine if screening should resume. Some women have medical problems that increase the chance of getting cervical cancer. In these cases, your health care provider may recommend more frequent screening and Pap tests.  Colorectal Cancer  This type of cancer can be detected and often prevented.  Routine colorectal cancer screening usually begins at 36 years of age and continues through 36 years of age.  Your health care provider may recommend screening at an earlier age if you have risk factors for colon cancer.  Your health care provider may also recommend using home test kits to check for hidden blood in the stool.  A small camera at the end of a tube can be used to examine your colon directly (sigmoidoscopy or colonoscopy). This is done to  check for the earliest forms of colorectal cancer.  Routine screening usually begins at age 70.  Direct examination of the colon should be repeated every 5-10 years through 36 years of age. However, you may need to be screened more often if early forms of precancerous polyps or small growths are found.  Skin Cancer  Check your skin from head to toe regularly.  Tell your health care provider about any new moles or changes in moles, especially if there is a change in a mole's shape or color.  Also tell your health care provider if you have a mole that is larger than the size of a pencil eraser.  Always use sunscreen. Apply sunscreen liberally and repeatedly throughout the day.  Protect yourself by wearing long sleeves, pants, a wide-brimmed hat, and sunglasses whenever you are outside.  Heart disease, diabetes, and high blood pressure  High blood pressure causes heart disease and increases the risk of stroke. High blood pressure is more likely to develop in: ? People who have blood  pressure in the high end of the normal range (130-139/85-89 mm Hg). ? People who are overweight or obese. ? People who are African American.  If you are 38-72 years of age, have your blood pressure checked every 3-5 years. If you are 16 years of age or older, have your blood pressure checked every year. You should have your blood pressure measured twice-once when you are at a hospital or clinic, and once when you are not at a hospital or clinic. Record the average of the two measurements. To check your blood pressure when you are not at a hospital or clinic, you can use: ? An automated blood pressure machine at a pharmacy. ? A home blood pressure monitor.  If you are between 32 years and 23 years old, ask your health care provider if you should take aspirin to prevent strokes.  Have regular diabetes screenings. This involves taking a blood sample to check your fasting blood sugar level. ? If you are at a  normal weight and have a low risk for diabetes, have this test once every three years after 36 years of age. ? If you are overweight and have a high risk for diabetes, consider being tested at a younger age or more often. Preventing infection Hepatitis B  If you have a higher risk for hepatitis B, you should be screened for this virus. You are considered at high risk for hepatitis B if: ? You were born in a country where hepatitis B is common. Ask your health care provider which countries are considered high risk. ? Your parents were born in a high-risk country, and you have not been immunized against hepatitis B (hepatitis B vaccine). ? You have HIV or AIDS. ? You use needles to inject street drugs. ? You live with someone who has hepatitis B. ? You have had sex with someone who has hepatitis B. ? You get hemodialysis treatment. ? You take certain medicines for conditions, including cancer, organ transplantation, and autoimmune conditions.  Hepatitis C  Blood testing is recommended for: ? Everyone born from [birth year] through 1964. ? Anyone with known risk factors for hepatitis C.  Sexually transmitted infections (STIs)  You should be screened for sexually transmitted infections (STIs) including gonorrhea and chlamydia if: ? You are sexually active and are younger than 36 years of age. ? You are older than 36 years of age and your health care provider tells you that you are at risk for this type of infection. ? Your sexual activity has changed since you were last screened and you are at an increased risk for chlamydia or gonorrhea. Ask your health care provider if you are at risk.  If you do not have HIV, but are at risk, it may be recommended that you take a prescription medicine daily to prevent HIV infection. This is called pre-exposure prophylaxis (PrEP). You are considered at risk if: ? You are sexually active and do not regularly use condoms or know the HIV status of your  partner(s). ? You take drugs by injection. ? You are sexually active with a partner who has HIV.  Talk with your health care provider about whether you are at high risk of being infected with HIV. If you choose to begin PrEP, you should first be tested for HIV. You should then be tested every 3 months for as long as you are taking PrEP. Pregnancy  If you are premenopausal and you may become pregnant, ask your health care provider about preconception  counseling.  If you may become pregnant, take 400 to 800 micrograms (mcg) of folic acid every day.  If you want to prevent pregnancy, talk to your health care provider about birth control (contraception). Osteoporosis and menopause  Osteoporosis is a disease in which the bones lose minerals and strength with aging. This can result in serious bone fractures. Your risk for osteoporosis can be identified using a bone density scan.  If you are 69 years of age or older, or if you are at risk for osteoporosis and fractures, ask your health care provider if you should be screened.  Ask your health care provider whether you should take a calcium or vitamin D supplement to lower your risk for osteoporosis.  Menopause may have certain physical symptoms and risks.  Hormone replacement therapy may reduce some of these symptoms and risks. Talk to your health care provider about whether hormone replacement therapy is right for you. Follow these instructions at home:  Schedule regular health, dental, and eye exams.  Stay current with your immunizations.  Do not use any tobacco products including cigarettes, chewing tobacco, or electronic cigarettes.  If you are pregnant, do not drink alcohol.  If you are breastfeeding, limit how much and how often you drink alcohol.  Limit alcohol intake to no more than 1 drink per day for nonpregnant women. One drink equals 12 ounces of beer, 5 ounces of wine, or 1 ounces of hard liquor.  Do not use street  drugs.  Do not share needles.  Ask your health care provider for help if you need support or information about quitting drugs.  Tell your health care provider if you often feel depressed.  Tell your health care provider if you have ever been abused or do not feel safe at home. This information is not intended to replace advice given to you by your health care provider. Make sure you discuss any questions you have with your health care provider. Document Released: 07/17/2010 Document Revised: 06/09/2015 Document Reviewed: 10/05/2014 Elsevier Interactive Patient Education  2018 [employer] 18-39 Years, Female Preventive care refers to lifestyle choices and visits with your health care provider that can promote health and wellness. What does preventive care include?  A yearly physical exam. This is also called an annual well check.  Dental exams once or twice a year.  Routine eye exams. Ask your health care provider how often you should have your eyes checked.  Personal lifestyle choices, including: ? Daily care of your teeth and gums. ? Regular physical activity. ? Eating a healthy diet. ? Avoiding tobacco and drug use. ? Limiting alcohol use. ? Practicing safe sex. ? Taking vitamin and mineral supplements as recommended by your health care provider. What happens during an annual well check? The services and screenings done by your health care provider during your annual well check will depend on your age, overall health, lifestyle risk factors, and family history of disease. Counseling Your health care provider may ask you questions about your:  Alcohol use.  Tobacco use.  Drug use.  Emotional well-being.  Home and relationship well-being.  Sexual activity.  Eating habits.  Work and work Statistician.  Method of birth control.  Menstrual cycle.  Pregnancy history.  Screening You may have the following tests or measurements:  Height,  weight, and BMI.  Diabetes screening. This is done by checking your blood sugar (glucose) after you have not eaten for a while (fasting).  Blood pressure.  Lipid and  cholesterol levels. These may be checked every 5 years starting at age 62.  Skin check.  Hepatitis C blood test.  Hepatitis B blood test.  Sexually transmitted disease (STD) testing.  BRCA-related cancer screening. This may be done if you have a family history of breast, ovarian, tubal, or peritoneal cancers.  Pelvic exam and Pap test. This may be done every 3 years starting at age 28. Starting at age 71, this may be done every 5 years if you have a Pap test in combination with an HPV test.  Discuss your test results, treatment options, and if necessary, the need for more tests with your health care provider. Vaccines Your health care provider may recommend certain vaccines, such as:  Influenza vaccine. This is recommended every year.  Tetanus, diphtheria, and acellular pertussis (Tdap, Td) vaccine. You may need a Td booster every 10 years.  Varicella vaccine. You may need this if you have not been vaccinated.  HPV vaccine. If you are 78 or younger, you may need three doses over 6 months.  Measles, mumps, and rubella (MMR) vaccine. You may need at least one dose of MMR. You may also need a second dose.  Pneumococcal 13-valent conjugate (PCV13) vaccine. You may need this if you have certain conditions and were not previously vaccinated.  Pneumococcal polysaccharide (PPSV23) vaccine. You may need one or two doses if you smoke cigarettes or if you have certain conditions.  Meningococcal vaccine. One dose is recommended if you are age 35-21 years and a first-year college student living in a residence hall, or if you have one of several medical conditions. You may also need additional booster doses.  Hepatitis A vaccine. You may need this if you have certain conditions or if you travel or work in places where you may  be exposed to hepatitis A.  Hepatitis B vaccine. You may need this if you have certain conditions or if you travel or work in places where you may be exposed to hepatitis B.  Haemophilus influenzae type b (Hib) vaccine. You may need this if you have certain risk factors.  Talk to your health care provider about which screenings and vaccines you need and how often you need them. This information is not intended to replace advice given to you by your health care provider. Make sure you discuss any questions you have with your health care provider. Document Released: 02/27/2001 Document Revised: 09/21/2015 Document Reviewed: 11/02/2014 Elsevier Interactive Patient Education  [employer].

## 2017-07-09 ENCOUNTER — Ambulatory Visit: Payer: Self-pay | Admitting: Nurse Practitioner

## 2017-07-09 VITALS — BP 90/65 | HR 66 | Temp 98.4°F | Resp 16 | Wt 157.8 lb

## 2017-07-09 DIAGNOSIS — B9689 Other specified bacterial agents as the cause of diseases classified elsewhere: Secondary | ICD-10-CM

## 2017-07-09 DIAGNOSIS — L259 Unspecified contact dermatitis, unspecified cause: Secondary | ICD-10-CM

## 2017-07-09 DIAGNOSIS — L089 Local infection of the skin and subcutaneous tissue, unspecified: Secondary | ICD-10-CM

## 2017-07-09 MED ORDER — TRIAMCINOLONE ACETONIDE 0.1 % EX CREA: 1.0000 "application " | TOPICAL_CREAM | Freq: Two times a day (BID) | CUTANEOUS | 0 refills | Status: DC

## 2017-07-09 MED ORDER — SULFAMETHOXAZOLE-TRIMETHOPRIM 800-160 MG PO TABS: 1.0000 | ORAL_TABLET | Freq: Two times a day (BID) | ORAL | 0 refills | Status: AC

## 2017-07-09 MED ORDER — CEPHALEXIN 500 MG PO CAPS: 500.0000 mg | ORAL_CAPSULE | Freq: Three times a day (TID) | ORAL | 0 refills | Status: AC

## 2017-07-09 MED FILL — SULFAMETHOXAZOLE-TMP DS TAB: 800-160 | 14 days supply | Qty: 28 | Fill #0

## 2017-07-09 MED FILL — CEPHALEXIN 500 MG CAPSULE: 500 | 14 days supply | Qty: 42 | Fill #0

## 2017-07-09 MED FILL — TRIAMCINOLONE 0.1% CREAM: 0.1 | 20 days supply | Qty: 30 | Fill #0

## 2017-07-09 NOTE — Patient Instructions (Addendum)
Contact Dermatitis Dermatitis is redness, soreness, and swelling (inflammation) of the skin. Contact dermatitis is a reaction to certain substances that touch the skin. There are two types of contact dermatitis:  Irritant contact dermatitis. This type is caused by something that irritates your skin, such as dry hands from washing them too much. This type does not require previous exposure to the substance for a reaction to occur. This type is more common.  Allergic contact dermatitis. This type is caused by a substance that you are allergic to, such as a nickel allergy or poison ivy. This type only occurs if you have been exposed to the substance (allergen) before. Upon a repeat exposure, your body reacts to the substance. This type is less common.  What are the causes? Many different substances can cause contact dermatitis. Irritant contact dermatitis is most commonly caused by exposure to:  Makeup.  Soaps.  Detergents.  Bleaches.  Acids.  Metal salts, such as nickel.  Allergic contact dermatitis is most commonly caused by exposure to:  Poisonous plants.  Chemicals.  Jewelry.  Latex.  Medicines.  Preservatives in products, such as clothing.  What increases the risk? This condition is more likely to develop in:  People who have jobs that expose them to irritants or allergens.  People who have certain medical conditions, such as asthma or eczema.  What are the signs or symptoms? Symptoms of this condition may occur anywhere on your body where the irritant has touched you or is touched by you. Symptoms include:  Dryness or flaking.  Redness.  Cracks.  Itching.  Pain or a burning feeling.  Blisters.  Drainage of small amounts of blood or clear fluid from skin cracks.  With allergic contact dermatitis, there may also be swelling in areas such as the eyelids, mouth, or genitals. How is this diagnosed? This condition is diagnosed with a medical history and  physical exam. A patch skin test may be performed to help determine the cause. If the condition is related to your job, you may need to see an occupational medicine specialist. How is this treated? Treatment for this condition includes figuring out what caused the reaction and protecting your skin from further contact. Treatment may also include:  Steroid creams or ointments. Oral steroid medicines may be needed in more severe cases.  Antibiotics or antibacterial ointments, if a skin infection is present.  Antihistamine lotion or an antihistamine taken by mouth to ease itching.  A bandage (dressing).  Follow these instructions at home: Skin Care  Moisturize your skin as needed.  Apply cool compresses to the affected areas.  Try taking a bath with: ? Epsom salts. Follow the instructions on the packaging. You can get these at your local pharmacy or grocery store. ? Baking soda. Pour a small amount into the bath as directed by your health care provider. ? Colloidal oatmeal. Follow the instructions on the packaging. You can get this at your local pharmacy or grocery store.  Try applying baking soda paste to your skin. Stir water into baking soda until it reaches a paste-like consistency.  Do not scratch your skin.  Bathe less frequently, such as every other day.  Bathe in lukewarm water. Avoid using hot water. Medicines  Take or apply over-the-counter and prescription medicines only as told by your health care provider.  If you were prescribed an antibiotic medicine, take or apply your antibiotic as told by your health care provider. Do not stop using the antibiotic even if your condition   starts to improve. General instructions  Keep all follow-up visits as told by your health care provider. This is important.  Avoid the substance that caused your reaction. If you do not know what caused it, keep a journal to try to track what caused it. Write down: ? What you eat. ? What  cosmetic products you use. ? What you drink. ? What you wear in the affected area. This includes jewelry.  If you were given a dressing, take care of it as told by your health care provider. This includes when to change and remove it. Contact a health care provider if:  Your condition does not improve with treatment.  Your condition gets worse.  You have signs of infection such as swelling, tenderness, redness, soreness, or warmth in the affected area.  You have a fever.  You have new symptoms. Get help right away if:  You have a severe headache, neck pain, or neck stiffness.  You vomit.  You feel very sleepy.  You notice red streaks coming from the affected area.  Your bone or joint underneath the affected area becomes painful after the skin has healed.  The affected area turns darker.  You have difficulty breathing. This information is not intended to replace advice given to you by your health care provider. Make sure you discuss any questions you have with your health care provider. Document Released: 12/30/1999 Document Revised: 06/09/2015 Document Reviewed: 05/19/2014 Elsevier Interactive Patient Education  2018 Else[employer]  Cellulitis, Adult Cellulitis is a skin infection. The infected area is usually red and tender. This condition occurs most often in the arms and lower legs. The infection can travel to the muscles, blood, and underlying tissue and become serious. It is very important to get treated for this condition. What are the causes? Cellulitis is caused by bacteria. The bacteria enter through a break in the skin, such as a cut, burn, insect bite, open sore, or crack. What increases the risk? This condition is more likely to occur in people who:  Have a weak defense system (immune system).  Have open wounds on the skin such as cuts, burns, bites, and scrapes. Bacteria can enter the body through these open wounds.  Are older.  Have diabetes.  Have a  type of long-lasting (chronic) liver disease (cirrhosis) or kidney disease.  Use IV drugs.  What are the signs or symptoms? Symptoms of this condition include:  Redness, streaking, or spotting on the skin.  Swollen area of the skin.  Tenderness or pain when an area of the skin is touched.  Warm skin.  Fever.  Chills.  Blisters.  How is this diagnosed? This condition is diagnosed based on a medical history and physical exam. You may also have tests, including:  Blood tests.  Lab tests.  Imaging tests.  How is this treated? Treatment for this condition may include:  Medicines, such as antibiotic medicines or antihistamines.  Supportive care, such as rest and application of cold or warm cloths (cold or warm compresses) to the skin.  Hospital care, if the condition is severe.  The infection usually gets better within 1-2 days of treatment. Follow these instructions at home:  Take over-the-counter and prescription medicines only as told by your health care provider.  If you were prescribed an antibiotic medicine, take it as told by your health care provider. Do not stop taking the antibiotic even if you start to feel better.  Drink enough fluid to keep your urine clear or pale  yellow.  Do not touch or rub the infected area.  Raise (elevate) the infected area above the level of your heart while you are sitting or lying down.  Apply warm or cold compresses to the affected area as told by your health care provider.  Keep all follow-up visits as told by your health care provider. This is important. These visits let your health care provider make sure a more serious infection is not developing.  Cleanse site with Hibiclens anti-bacterial soap at least twice daily until symptoms improve.  Continue to apply Mupirocin ointment to the area twice daily until symptoms improve.  Ibuprofen 800mg  up to three times daily as needed for pain, fever or general  discomfort. Contact a health care provider if:  You have a fever.  Your symptoms do not improve within 1-2 days of starting treatment.  Your bone or joint underneath the infected area becomes painful after the skin has healed.  Your infection returns in the same area or another area.  You notice a swollen bump in the infected area.  You develop new symptoms.  You have a general ill feeling (malaise) with muscle aches and pains. Get help right away if:  Your symptoms get worse.  You feel very sleepy.  You develop vomiting or diarrhea that persists.  You notice red streaks coming from the infected area.  Your red area gets larger or turns dark in color. This information is not intended to replace advice given to you by your health care provider. Make sure you discuss any questions you have with your health care provider. Document Released: 10/11/2004 Document Revised: 05/12/2015 Document Reviewed: 11/10/2014 Elsevier Interactive Patient Education  [employer].

## 2017-07-09 NOTE — Progress Notes (Signed)
Subjective:    Patient ID: Terri Fry, female    DOB: 1980-09-08, 37 y.o.   MRN: [id number]  The patient is a 37 y.o. Female who presents for complaints of a "sore" to the bottom of her right foot and a rash to her left foot.  The patient states the sore started approximately 1 week ago after she went on a 15 mile hike.  The patient states she noticed it about 1 day after.  The sore has gotten larger and has become painful.  Site has been drainage yellowish drainage.  Patient has been using tea tree oil and mupirocin ointment to the site along with Epsom salt soaks.  Today the area is painful to touch and continues to drain yellowish drainage.  Site around the area is red.      Rash  This is a new problem. The current episode started in the past 7 days. The problem is unchanged. The affected locations include the left foot. The rash is characterized by itchiness. She was exposed to nothing. Pertinent negatives include no fatigue, fever, joint pain, nail changes, rhinorrhea, sore throat or vomiting. Past treatments include antibiotic cream. The treatment provided no relief. There is no history of allergies, asthma or eczema.   Reviewed patient's past medical history, current medications and allergies.   Review of Systems  Constitutional: Negative for activity change, appetite change, chills, fatigue and fever.  HENT: Negative for rhinorrhea and sore throat.   Gastrointestinal: Negative for vomiting.  Musculoskeletal: Negative.  Negative for joint pain.  Skin: Positive for rash and wound. Negative for nail changes.       Rash to left foot at base of great toe and extends down inner aspect of left foot. Blistering wound to bottom of right foot.       Objective:   Physical Exam  Constitutional: She is oriented to person, place, and time. She appears well-developed and well-nourished. She appears distressed (due to right foot pain).  HENT:  Head: Normocephalic and atraumatic.  Eyes:  Pupils are equal, round, and reactive to light. EOM are normal.  Neck: Normal range of motion. Neck supple.  Cardiovascular: Normal rate and regular rhythm.  Pulmonary/Chest: Effort normal and breath sounds normal.  Neurological: She is alert and oriented to person, place, and time.  Skin: Skin is warm and dry. Rash noted. Rash is macular and urticarial. Rash is not pustular. No pallor.     Blistering wound to bottom of right foot, starts of base of ball of right foot and extends into medial longitudinal arch, measures approximately 3cm x 3 cm. + yellow drainage, no foul odor, no streaking, pain with touch. Base of wound is erythematous. (unable to demonstrate wound on drawing)  Rash to left foot starts at dorsal aspect of left great toe and extends to inner aspect of foot. No drainage, erythema, rash is flat.      Assessment & Plan:  Contact Dermatitis 1. Patient to apply triamcinolone cream to left foot twice daily until symptoms improve. 2.  Keep area moisturized as needed. 3. Continue Epsom salt soaks to the affected foot at least twice daily. 4. May take Benadryl 25mg  as needed for itching. 5. Patient education provided.   Bacterial Skin Infection, Right Foot 1. Take Bactrim twice daily and Keflex 3 times daily for 14 days. 2. May continue use of Mupirocin ointment to the affected areas until symptoms improve. 3. Cleanse right foot with Hibiclens soap twice daily until symptoms improve. 4. Ibuprofen  or Tylenol for pain, fever or general discomfort. 5. Avoid heavy pressure to right foot until symptoms improve. 6. Follow up in our office in 5-7 days if symptoms do not improve.  Will consider I & D if needed at that time.  If you develop fever, chills,malaise, redness or streaking up the right leg and foot, follow up in the emergency room. 7. Patient education provided.  Patient verbalizes understanding of all discharge instructions and has no questions at time of discharge. Meds  ordered this encounter  Medications  . cephALEXin (KEFLEX) 500 MG capsule    Sig: Take 1 capsule (500 mg total) by mouth 3 (three) times daily for 14 days.    Dispense:  42 capsule    Refill:  0    Order Specific Question:   Supervising Provider    Answer:   Stacie Glaze [[id number]]  . sulfamethoxazole-trimethoprim (BACTRIM DS) 800-160 MG tablet    Sig: Take 1 tablet by mouth 2 (two) times daily for 14 days.    Dispense:  28 tablet    Refill:  0    Order Specific Question:   Supervising Provider    Answer:   Stacie Glaze [[id number]]  . triamcinolone cream (KENALOG) 0.1 %    Sig: Apply 1 application topically 2 (two) times daily.    Dispense:  30 g    Refill:  0    Order Specific Question:   Supervising Provider    Answer:   Stacie Glaze [[id number]]          and Bacterial Skin Infection, Right Foot

## 2017-07-10 ENCOUNTER — Telehealth: Payer: Self-pay

## 2017-07-10 NOTE — Telephone Encounter (Signed)
Pt called and states the rash is not getting better and spreas a little bit, she started the medication yesterday, she just wanted to notify the provider about her situation

## 2017-07-14 DIAGNOSIS — L089 Local infection of the skin and subcutaneous tissue, unspecified: Secondary | ICD-10-CM | POA: Diagnosis not present

## 2017-07-14 DIAGNOSIS — L239 Allergic contact dermatitis, unspecified cause: Secondary | ICD-10-CM | POA: Diagnosis not present

## 2017-07-15 MED FILL — predniSONE 10 MG TABS: 10 | 12 days supply | Qty: 48 | Fill #0

## 2018-04-23 MED FILL — SUMATRIPTAN SUCC 100 MG TAB: 100 | 30 days supply | Qty: 9 | Fill #0

## 2018-05-29 ENCOUNTER — Other Ambulatory Visit: Payer: Self-pay

## 2018-05-29 ENCOUNTER — Ambulatory Visit
Admission: RE | Admit: 2018-05-29 | Discharge: 2018-05-29 | Disposition: A | Discharge status: Home / Self Care | Payer: No Typology Code available for payment source | Source: Ambulatory Visit | Attending: Family Medicine | Admitting: Family Medicine

## 2018-05-29 ENCOUNTER — Other Ambulatory Visit: Payer: Self-pay | Admitting: Family Medicine

## 2018-05-29 DIAGNOSIS — M79641 Pain in right hand: Secondary | ICD-10-CM

## 2018-05-29 MED FILL — RIZATRIPTAN BENZOATE 10 MG: 10 | 30 days supply | Qty: 10 | Fill #0

## 2018-06-02 ENCOUNTER — Telehealth: Payer: Self-pay | Admitting: Orthopaedic Surgery

## 2018-06-02 NOTE — Telephone Encounter (Signed)
Called patient left message for a call back to confirm appointment tomorrow

## 2018-06-03 ENCOUNTER — Other Ambulatory Visit: Payer: Self-pay

## 2018-06-03 ENCOUNTER — Ambulatory Visit (INDEPENDENT_AMBULATORY_CARE_PROVIDER_SITE_OTHER): Payer: No Typology Code available for payment source | Admitting: Orthopaedic Surgery

## 2018-06-03 ENCOUNTER — Encounter: Payer: Self-pay | Admitting: Orthopaedic Surgery

## 2018-06-03 DIAGNOSIS — S62356A Nondisplaced fracture of shaft of fifth metacarpal bone, right hand, initial encounter for closed fracture: Secondary | ICD-10-CM | POA: Diagnosis not present

## 2018-06-03 NOTE — Progress Notes (Signed)
Office Visit Note   Patient: Terri Fry           Date of Birth: 1980-03-17           MRN: [id number] Visit Date: 06/03/2018              Requested by: Caren Macadam, [place], [place] [address] PCP: Patient, No Pcp Per   Assessment & Plan: Visit Diagnoses:  1. Nondisplaced fracture of shaft of fifth metacarpal bone, right hand, initial encounter for closed fracture     Plan: Impression is nondisplaced left fifth metacarpal fracture.  We will mobilizes with a removable ulnar gutter brace.  I would like her to begin hand therapy in a week for range of motion and strengthening 4 weeks after that.  I would like to recheck her in 6 weeks for this.  She is doing well no x-rays are needed  Follow-Up Instructions: Return in about 6 weeks (around 07/15/2018).   Orders:  No orders of the defined types were placed in this encounter.  No orders of the defined types were placed in this encounter.     Procedures: No procedures performed   Clinical Data: No additional findings.   Subjective: Chief Complaint  Patient presents with  . Right Hand - Pain    Patient is a very pleasant 38 year old right-hand-dominant female comes in for evaluation of her left hand fracture that she sustained 3 weeks ago when she fell down some stairs.  She initially had bruising and pain in her left hand which failed to get better so she recently went to her PCPs office and got an x-ray which showed a nondisplaced fracture of the fifth metacarpal.  She works for the [employer] office.  She states that overall the pain is well controlled.  She has been splinting it with her own makeshift splint.   Review of Systems  Constitutional: Negative.   HENT: Negative.   Eyes: Negative.   Respiratory: Negative.   Cardiovascular: Negative.   Endocrine: Negative.   Musculoskeletal: Negative.   Neurological: Negative.   Hematological: Negative.   Psychiatric/Behavioral:  Negative.   All other systems reviewed and are negative.    Objective: Vital Signs: There were no vitals taken for this visit.  Physical Exam Vitals signs and nursing note reviewed.  Constitutional:      Appearance: She is well-developed.  HENT:     Head: Normocephalic and atraumatic.  Neck:     Musculoskeletal: Neck supple.  Pulmonary:     Effort: Pulmonary effort is normal.  Abdominal:     Palpations: Abdomen is soft.  Skin:    General: Skin is warm.     Capillary Refill: Capillary refill takes less than 2 seconds.  Neurological:     Mental Status: She is alert and oriented to person, place, and time.  Psychiatric:        Behavior: Behavior normal.        Thought Content: Thought content normal.        Judgment: Judgment normal.     Ortho Exam Left hand exam shows mild to moderate tenderness along the fifth metacarpal fracture.  There is no gross motion.  There is no rotational deformity.  She does have some residual ecchymosis around the hand.  No neurovascular compromise. Specialty Comments:  No specialty comments available.  Imaging: No results found.   PMFS History: Patient Active Problem List   Diagnosis Date Noted  . Genetic testing  12/17/2013  . BRCA2 positive 12/07/2013   Past Medical History:  Diagnosis Date  . Anxiety     Family History  Problem Relation Age of Onset  . Endometrial cancer Mother 11       stage IV clear cell  . BRCA 1/2 Mother        BRCA2 positive  . Colon cancer Maternal Grandmother 64  . Endometrial cancer Maternal Aunt 61  . Melanoma Other   . Breast cancer Other        dx in her 79s  . Ovarian cancer Other        dx in her 61s  . Stomach cancer Other   . BRCA 1/2 Maternal Aunt        BRCA2 negative    Past Surgical History:  Procedure Laterality Date  . CESAREAN SECTION     Social History   Occupational History  . Not on file  Tobacco Use  . Smoking status: Never Smoker  . Smokeless tobacco: Never Used   Substance and Sexual Activity  . Alcohol use: Yes  . Drug use: No  . Sexual activity: Yes

## 2018-06-04 ENCOUNTER — Telehealth: Payer: Self-pay | Admitting: Hematology and Oncology

## 2018-06-04 ENCOUNTER — Other Ambulatory Visit: Payer: Self-pay | Admitting: *Deleted

## 2018-06-04 DIAGNOSIS — Z1231 Encounter for screening mammogram for malignant neoplasm of breast: Secondary | ICD-10-CM

## 2018-06-04 NOTE — Telephone Encounter (Signed)
Scheduled appt per 5/20 sch message - unable to reach patient . Left message with appt date and time   

## 2018-07-04 ENCOUNTER — Encounter: Payer: Self-pay | Admitting: Nurse Practitioner

## 2018-07-05 ENCOUNTER — Other Ambulatory Visit: Payer: Self-pay

## 2018-07-05 ENCOUNTER — Ambulatory Visit
Admission: RE | Admit: 2018-07-05 | Discharge: 2018-07-05 | Disposition: A | Discharge status: Home / Self Care | Payer: No Typology Code available for payment source | Source: Ambulatory Visit | Attending: Hematology and Oncology | Admitting: Hematology and Oncology

## 2018-07-05 DIAGNOSIS — Z1231 Encounter for screening mammogram for malignant neoplasm of breast: Secondary | ICD-10-CM

## 2018-07-07 ENCOUNTER — Encounter: Payer: No Typology Code available for payment source | Admitting: Family

## 2018-07-07 NOTE — Progress Notes (Signed)
Based on what you shared with me, I feel your condition warrants further evaluation and I recommend that you be seen for a face to face office visit.  I am sorry, but we can not treat anyone under the age of 2 year through an [facility] and it must through their own account.   NOTE: If you entered your credit card information for this eVisit, you will not be charged. You may see a "hold" on your card for the $35 but that hold will drop off and you will not have a charge processed.  If you are having a true medical emergency please call 911.     For an urgent face to face visit, [place] has five urgent care centers for your convenience:    [email or web address] to reserve your spot online an avoid wait times  [facility] [address], [address] [place], [place] [address] Modified hours of operation: Monday-Friday, 12 PM to 6 PM  Closed Saturday & Sunday  *Across the street from [place] (New Address!) [address] [place], [place] [address] *Just off [address], across the road from [place] hours of operation: Monday-Friday, 12 PM to 6 PM  Closed Saturday & Sunday   The following sites will take your insurance:  . [facility]    [phone]                  Get Driving Directions  [address], [place] [address] . 10 am to 8 pm Monday-Friday . 12 pm to 8 pm Saturday-Sunday   . [facility] at [place]                  Get Driving Directions  [address], [place], [place] [address] . 8 am to 8 pm Monday-Friday . 9 am to 6 pm Saturday . 11 am to 6 pm Sunday   . [facility] at [place]                  Get Driving Directions   [address] [place], [place] [address] . 8 am to 8 pm Monday-Friday . 8 am to 4 pm Saturday-Sunday    . [facility] at [place]                    Get Driving Directions  [phone]  [address], [place], [place] [address]  . Monday-Friday, 12 PM to 6 PM    Your e-visit answers were reviewed by a board certified advanced clinical practitioner to complete your personal care plan.  Thank you for using e-Visits.

## 2018-07-16 ENCOUNTER — Other Ambulatory Visit: Payer: Self-pay

## 2018-07-16 ENCOUNTER — Ambulatory Visit (INDEPENDENT_AMBULATORY_CARE_PROVIDER_SITE_OTHER): Payer: No Typology Code available for payment source | Admitting: Orthopaedic Surgery

## 2018-07-16 ENCOUNTER — Ambulatory Visit: Payer: Self-pay

## 2018-07-16 DIAGNOSIS — S62356A Nondisplaced fracture of shaft of fifth metacarpal bone, right hand, initial encounter for closed fracture: Secondary | ICD-10-CM | POA: Diagnosis not present

## 2018-07-16 NOTE — Progress Notes (Signed)
Patient: Terri Fry           Date of Birth: 01-31-1980           MRN: [id number] Visit Date: 07/16/2018 PCP: Patient, No Pcp Per   Assessment & Plan:  Chief Complaint:  Chief Complaint  Patient presents with  . Right Hand - Follow-up   Visit Diagnoses:  1. Nondisplaced fracture of shaft of fifth metacarpal bone, right hand, initial encounter for closed fracture     Plan: Patient comes in today approximately 2 months status post nondisplaced fifth metacarpal fracture.  She has been doing well overall.  2 weeks ago she did jam her hand into a wall accidentally which caused some pain for couple days but this pain has completely resolved.  She is got full range of motion of her fingers and use of the hand.  She never needed to attend hand therapy.  She has great grip strength and full range of motion on exam today therefore we deferred x-rays.  She has no pain with palpation along the fifth metacarpal.  She has no swelling.  At this point we will release her to activity as tolerated.  Questions encouraged and answered.  She will ease back into activity as tolerated.  Follow-up as needed.  Follow-Up Instructions: Return if symptoms worsen or fail to improve.   Orders:  No orders of the defined types were placed in this encounter.  No orders of the defined types were placed in this encounter.   Imaging: No results found.  PMFS History: Patient Active Problem List   Diagnosis Date Noted  . Genetic testing 12/17/2013  . BRCA2 positive 12/07/2013   Past Medical History:  Diagnosis Date  . Anxiety     Family History  Problem Relation Age of Onset  . Endometrial cancer Mother 80       stage IV clear cell  . BRCA 1/2 Mother        BRCA2 positive  . Colon cancer Maternal Grandmother 30  . Endometrial cancer Maternal Aunt 61  . Melanoma Other   . Breast cancer Other        dx in her 69s  . Ovarian cancer Other        dx in her 25s  . Stomach cancer Other   .  BRCA 1/2 Maternal Aunt        BRCA2 negative    Past Surgical History:  Procedure Laterality Date  . CESAREAN SECTION     Social History   Occupational History  . Not on file  Tobacco Use  . Smoking status: Never Smoker  . Smokeless tobacco: Never Used  Substance and Sexual Activity  . Alcohol use: Yes  . Drug use: No  . Sexual activity: Yes

## 2018-07-21 ENCOUNTER — Ambulatory Visit: Payer: No Typology Code available for payment source

## 2018-07-22 ENCOUNTER — Telehealth: Payer: Self-pay | Admitting: Hematology and Oncology

## 2018-07-22 NOTE — Assessment & Plan Note (Signed)
BRCA2 [id number] mutation:  Breast cancer surveillance: 1. Breast exam 01/06/2015 normal 2. Mammograms 3. breast MRI 07/24/2018 I recommended annual mammograms and breast MRIs.  Ovarian cancer surveillance: 1. GYN evaluations 2. CT abdomen and pelvis 12/17/2013 for lower abdominal pain: Negative for cancer  3. Patient is evaluating different options including oophorectomy and will discuss this with her gynecologist. She has plans to undergo oophorectomy when she is 38 years old.  Return to clinic once a year with mammograms and MRI breast and follow-up

## 2018-07-22 NOTE — Telephone Encounter (Signed)
Faxed medical record to [facility] @ [phone].

## 2018-07-24 ENCOUNTER — Other Ambulatory Visit: Payer: Self-pay

## 2018-07-24 ENCOUNTER — Ambulatory Visit
Admission: RE | Admit: 2018-07-24 | Discharge: 2018-07-24 | Disposition: A | Discharge status: Home / Self Care | Payer: No Typology Code available for payment source | Source: Ambulatory Visit | Attending: Hematology and Oncology | Admitting: Hematology and Oncology

## 2018-07-24 DIAGNOSIS — Z1231 Encounter for screening mammogram for malignant neoplasm of breast: Secondary | ICD-10-CM

## 2018-07-24 MED ORDER — GADOBUTROL 1 MMOL/ML IV SOLN
7.0000 mL | Freq: Once | INTRAVENOUS | Status: AC | PRN
  Administered 2018-07-24: 7 mL via INTRAVENOUS

## 2018-07-25 ENCOUNTER — Ambulatory Visit: Payer: No Typology Code available for payment source

## 2018-07-25 ENCOUNTER — Other Ambulatory Visit: Payer: Self-pay | Admitting: Hematology and Oncology

## 2018-07-25 ENCOUNTER — Other Ambulatory Visit: Payer: Self-pay | Admitting: *Deleted

## 2018-07-25 DIAGNOSIS — R928 Other abnormal and inconclusive findings on diagnostic imaging of breast: Secondary | ICD-10-CM

## 2018-07-26 NOTE — Progress Notes (Signed)
Terri Fry CONSULT NOTE  Patient Care Team: Patient, No Pcp Per as PCP - General (General Practice)  CHIEF COMPLAINTS/PURPOSE OF CONSULTATION:  BRCA2 mutation, new consultation for thrombocytopenia platelets 108 and elevated neutrophils  HISTORY OF PRESENTING ILLNESS:  Terri Fry 38 y.o. female is here because of a history of BRCA 2 mutation. She is currently on surveillance with yearly breast MRIs and mammograms. I last saw her 3.5 years ago. Breast MRI on 07/24/18 showed a 88m indeterminate enhancing mass in the left breast not present on previous MRIs. Biopsy is scheduled for 08/04/18.  She has not been annually getting her MRIs.  She tells me that her mother passed away and she has had some family issues. The reason for today's consultation is to review her blood work regarding a platelet count of 108.  She has never had any bleeding problems with prior surgeries but she tells me that during her child birth, she was told that she cannot get epidural injection because of low platelets.  We do not have those records at this time.  The most recent CBC done at her primary care office showed a platelet count of 108.  There was a slight increase in relative neutrophil count but her absolute neutrophil count is normal.  I reviewed her records extensively and collaborated the history with the patient.  MEDICAL HISTORY:  Past Medical History:  Diagnosis Date  . Anxiety   BRCA2 mutation  SURGICAL HISTORY: Past Surgical History:  Procedure Laterality Date  . CESAREAN SECTION      SOCIAL HISTORY: Denies any tobacco alcohol or recreational drug use FAMILY HISTORY: Family History  Problem Relation Age of Onset  . Endometrial cancer Mother 676      stage IV clear cell  . BRCA 1/2 Mother        BRCA2 positive  . Colon cancer Maternal Grandmother 863 . Endometrial cancer Maternal Aunt 61  . Melanoma Other   . Breast cancer Other        dx in her 649s . Ovarian cancer  Other        dx in her 65s . Stomach cancer Other   . BRCA 1/2 Maternal Aunt        BRCA2 negative    ALLERGIES:  has No Known Allergies.  MEDICATIONS:   Current Outpatient Medications  Medication Sig Dispense Refill  . rizatriptan (MAXALT) 10 MG tablet      No current facility-administered medications for this visit.     REVIEW OF SYSTEMS:   Constitutional: Denies fevers, chills or abnormal night sweats Eyes: Denies blurriness of vision, double vision or watery eyes Ears, nose, mouth, throat, and face: Denies mucositis or sore throat Respiratory: Denies cough, dyspnea or wheezes Cardiovascular: Denies palpitation, chest discomfort or lower extremity swelling Gastrointestinal:  Denies nausea, heartburn or change in bowel habits Skin: Denies abnormal skin rashes Lymphatics: Denies new lymphadenopathy or easy bruising Neurological:Denies numbness, tingling or new weaknesses Behavioral/Psych: Mood is stable, no new changes  Breast:  Denies any palpable lumps or discharge All other systems were reviewed with the patient and are negative.  PHYSICAL EXAMINATION: ECOG PERFORMANCE STATUS: 0 - Asymptomatic  Vitals:   07/28/18 1025  BP: 121/73  Pulse: 68  Resp: 20  Temp: 99 F (37.2 C)  SpO2: 100%   Filed Weights   07/28/18 1025  Weight: 160 lb 4.8 oz (72.7 kg)    GENERAL:alert, no distress and comfortable SKIN: skin color,  texture, turgor are normal, no rashes or significant lesions EYES: normal, conjunctiva are pink and non-injected, sclera clear OROPHARYNX:no exudate, no erythema and lips, buccal mucosa, and tongue normal  NECK: supple, thyroid normal size, non-tender, without nodularity LYMPH:  no palpable lymphadenopathy in the cervical, axillary or inguinal LUNGS: clear to auscultation and percussion with normal breathing effort HEART: regular rate & rhythm and no murmurs and no lower extremity edema ABDOMEN:abdomen soft, non-tender and normal bowel  sounds Musculoskeletal:no cyanosis of digits and no clubbing  PSYCH: alert & oriented x 3 with fluent speech NEURO: no focal motor/sensory deficits  Lab review: Platelets 108, WBC count normal, ANC 5K,  RADIOGRAPHIC STUDIES: I have personally reviewed the radiological reports and agreed with the findings in the report.  ASSESSMENT AND PLAN:  BRCA2 positive BRCA2 [id number] mutation:  Breast cancer surveillance: 1. Breast exam 01/06/2015 normal 2. Mammograms 3. breast MRI 07/24/2018 I recommended annual mammograms and breast MRIs.  She assured me that she will be compliant with this recommendation.  Ovarian cancer surveillance: 1. GYN evaluations 2. CT abdomen and pelvis 12/17/2013 for lower abdominal pain: Negative for cancer  3. Patient is evaluating different options including oophorectomy and will discuss this with her gynecologist. She has plans to undergo oophorectomy when she is 38 years old.  ------------------------------------------------------------------------------------------------------------------------------------------ Thrombocytopenia: I discussed with her different causes of low platelets and I would characterize her thrombocytopenia as being mild.  We discussed different causes and concluded that the most likely cause of her chronic thrombocytopenia could be low-grade ITP or nutritional deficiencies.  Because there is no comparison to previous values, it is difficult to understand how the trends are. I recommended performing blood work that included making sure that there is no platelet clumping by obtaining her CBC in a blue top tube with citrate.  We also discussed about getting an ultrasound of the abdomen for evaluation of enlarged spleen and hepatitis B and C testing. We also discussed the indications to treat thrombocytopenia would be if it was to be below 50 and cause bleeding.   Based on these discussions patient decided that she does not want to get  any additional blood work because she has had very hard stick.   Return to clinic once a year with mammograms and MRI breast and follow-up.   All questions were answered. The patient knows to call the clinic with any problems, questions or concerns.    Harriette Ohara, MD 07/28/18

## 2018-07-28 ENCOUNTER — Other Ambulatory Visit: Payer: Self-pay

## 2018-07-28 ENCOUNTER — Inpatient Hospital Stay
Payer: No Typology Code available for payment source | Attending: Hematology and Oncology | Admitting: Hematology and Oncology

## 2018-07-28 DIAGNOSIS — D696 Thrombocytopenia, unspecified: Secondary | ICD-10-CM | POA: Diagnosis not present

## 2018-07-28 DIAGNOSIS — Z803 Family history of malignant neoplasm of breast: Secondary | ICD-10-CM

## 2018-07-28 DIAGNOSIS — F419 Anxiety disorder, unspecified: Secondary | ICD-10-CM | POA: Diagnosis not present

## 2018-07-28 DIAGNOSIS — Z1501 Genetic susceptibility to malignant neoplasm of breast: Secondary | ICD-10-CM

## 2018-08-04 ENCOUNTER — Other Ambulatory Visit: Payer: Self-pay

## 2018-08-04 ENCOUNTER — Ambulatory Visit
Admission: RE | Admit: 2018-08-04 | Discharge: 2018-08-04 | Disposition: A | Discharge status: Home / Self Care | Payer: No Typology Code available for payment source | Source: Ambulatory Visit | Attending: Hematology and Oncology | Admitting: Hematology and Oncology

## 2018-08-04 DIAGNOSIS — R928 Other abnormal and inconclusive findings on diagnostic imaging of breast: Secondary | ICD-10-CM

## 2018-08-04 HISTORY — PX: BREAST BIOPSY: SHX20

## 2018-08-04 MED ORDER — GADOBUTROL 1 MMOL/ML IV SOLN
7.0000 mL | Freq: Once | INTRAVENOUS | Status: AC | PRN
  Administered 2018-08-04: 7 mL via INTRAVENOUS

## 2019-03-03 ENCOUNTER — Other Ambulatory Visit: Payer: Self-pay

## 2019-03-03 ENCOUNTER — Ambulatory Visit (INDEPENDENT_AMBULATORY_CARE_PROVIDER_SITE_OTHER): Payer: No Typology Code available for payment source

## 2019-03-03 ENCOUNTER — Ambulatory Visit (INDEPENDENT_AMBULATORY_CARE_PROVIDER_SITE_OTHER): Payer: No Typology Code available for payment source | Admitting: Orthopaedic Surgery

## 2019-03-03 ENCOUNTER — Encounter: Payer: Self-pay | Admitting: Orthopaedic Surgery

## 2019-03-03 VITALS — Ht 66.0 in | Wt 158.0 lb

## 2019-03-03 DIAGNOSIS — M25562 Pain in left knee: Secondary | ICD-10-CM | POA: Diagnosis not present

## 2019-03-03 NOTE — Progress Notes (Signed)
Office Visit Note   Patient: Terri Fry           Date of Birth: 14-Jun-1980           MRN: [id number] Visit Date: 03/03/2019              Requested by: No referring provider defined for this encounter. PCP: Patient, No Pcp Per   Assessment & Plan: Visit Diagnoses:  1. Acute pain of left knee     Plan: Impression is acute left knee pain suspect degenerative meniscus tear versus symptomatic plica.  No joint effusion on exam.  I recommended using knee brace and steadily increase activity as tolerated.  We also discussed the possibility of trying cortisone injection versus MRI for further evaluation but she would like to hold off on this for now and see how she feels as she increases her activity.  She will let us know if she does not improve.  Follow-Up Instructions: Return if symptoms worsen or fail to improve.   Orders:  Orders Placed This Encounter  Procedures  . XR KNEE 3 VIEW LEFT   No orders of the defined types were placed in this encounter.     Procedures: No procedures performed   Clinical Data: No additional findings.   Subjective: Chief Complaint  Patient presents with  . Left Knee - Pain    Patient is a 39 year old female who comes in for evaluation of left knee pain since January when she was exercising and she felt a pop in her knee.  Originally she had pain anteriorly but no swelling.  Since then the pain has improved but she still has some pain around the medial patellofemoral facet and the posterior lateral region of the knee.  She has been taking NSAIDs which has helped temporarily.  She mainly has trouble with running on an incline or going up and down stairs.  Denies any numbness and tingling.  She does endorse some occasional catching.  She feels painless popping of the patella with knee extension.   Review of Systems  Constitutional: Negative.   HENT: Negative.   Eyes: Negative.   Respiratory: Negative.   Cardiovascular: Negative.    Endocrine: Negative.   Musculoskeletal: Negative.   Neurological: Negative.   Hematological: Negative.   Psychiatric/Behavioral: Negative.   All other systems reviewed and are negative.    Objective: Vital Signs: Ht '5\' 6"'  (1.676 m)   Wt 158 lb (71.7 kg)   BMI 25.50 kg/m   Physical Exam Vitals and nursing note reviewed.  Constitutional:      Appearance: She is well-developed.  HENT:     Head: Normocephalic and atraumatic.  Pulmonary:     Effort: Pulmonary effort is normal.  Abdominal:     Palpations: Abdomen is soft.  Musculoskeletal:     Cervical back: Neck supple.  Skin:    General: Skin is warm.     Capillary Refill: Capillary refill takes less than 2 seconds.  Neurological:     Mental Status: She is alert and oriented to person, place, and time.  Psychiatric:        Behavior: Behavior normal.        Thought Content: Thought content normal.        Judgment: Judgment normal.     Ortho Exam Left knee exam shows no obvious evidence of effusion.  She has no significant joint line tenderness.  Negative McMurray.  Negative Thessaly.  Normal range of motion with mild pain.  Pain is mainly along the medial patellofemoral facet.  No palpable popliteal masses.  Collaterals and cruciates are stable. Specialty Comments:  No specialty comments available.  Imaging: XR KNEE 3 VIEW LEFT  Result Date: 03/03/2019 No acute or structural abnormalities    PMFS History: Patient Active Problem List   Diagnosis Date Noted  . Genetic testing 12/17/2013  . BRCA2 positive 12/07/2013   Past Medical History:  Diagnosis Date  . Anxiety     Family History  Problem Relation Age of Onset  . Endometrial cancer Mother 79       stage IV clear cell  . BRCA 1/2 Mother        BRCA2 positive  . Colon cancer Maternal Grandmother 53  . Endometrial cancer Maternal Aunt 61  . Melanoma Other   . Breast cancer Other        dx in her 43s  . Ovarian cancer Other        dx in her 44s  .  Stomach cancer Other   . BRCA 1/2 Maternal Aunt        BRCA2 negative    Past Surgical History:  Procedure Laterality Date  . CESAREAN SECTION     Social History   Occupational History  . Not on file  Tobacco Use  . Smoking status: Never Smoker  . Smokeless tobacco: Never Used  Substance and Sexual Activity  . Alcohol use: Yes  . Drug use: No  . Sexual activity: Yes

## 2019-05-19 MED FILL — AMOXICILLIN 500 MG CAPSULE: 500 | 30 days supply | Qty: 180 | Fill #0

## 2019-05-19 MED FILL — DOXYCYCLINE HYCLATE 100 MG: 100 | 30 days supply | Qty: 60 | Fill #0

## 2019-07-14 MED FILL — PROGESTERONE 100 MG CAPS: 100 | 90 days supply | Qty: 33 | Fill #0

## 2019-07-14 MED FILL — NP THYROID 60 MG TABLET: 60 | 30 days supply | Qty: 30 | Fill #0

## 2019-08-14 IMAGING — MG DIGITAL SCREENING BILATERAL MAMMOGRAM WITH CAD
5 series · 5 of 5 positions shown · non-contrast
Comparison: Previous exam(s).

CLINICAL DATA: Screening.

EXAM:
DIGITAL SCREENING BILATERAL MAMMOGRAM WITH CAD

[R CC]
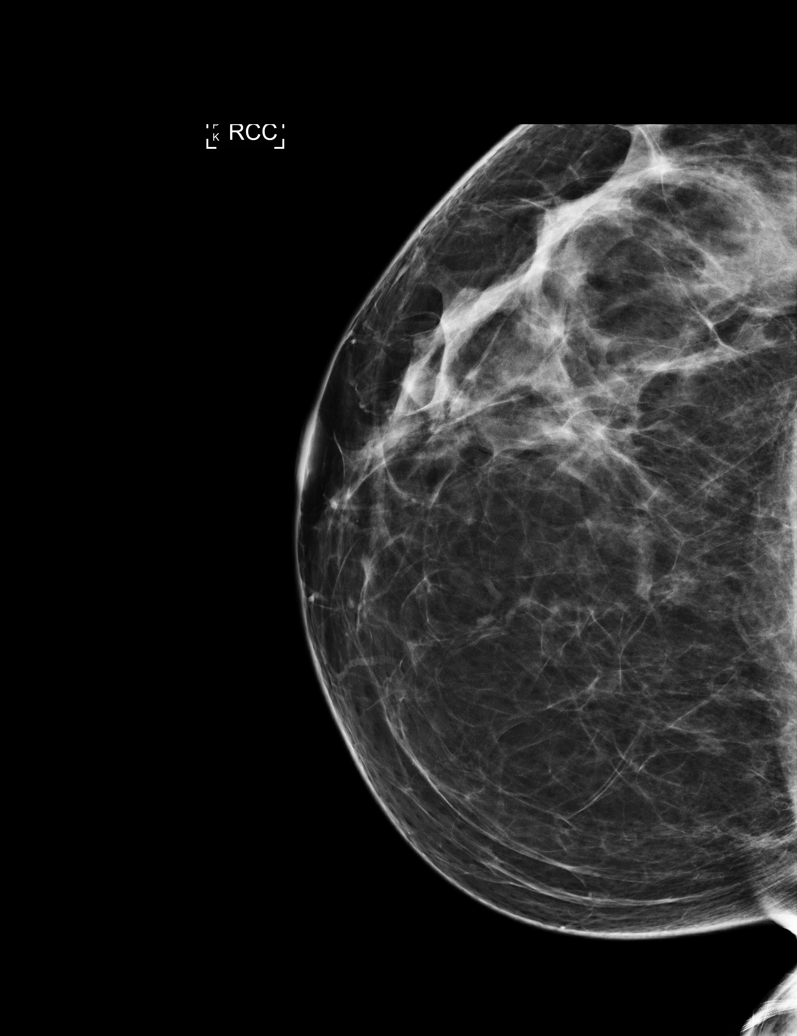

[L MLO (1 of 2)]
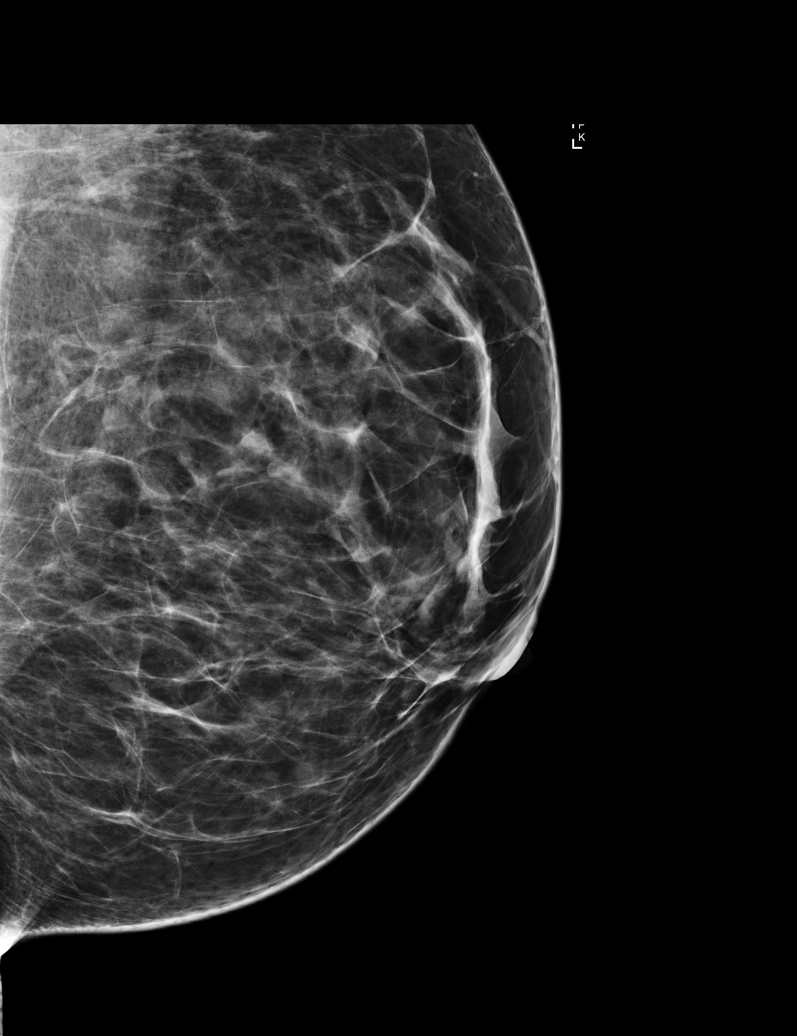

[L CC]
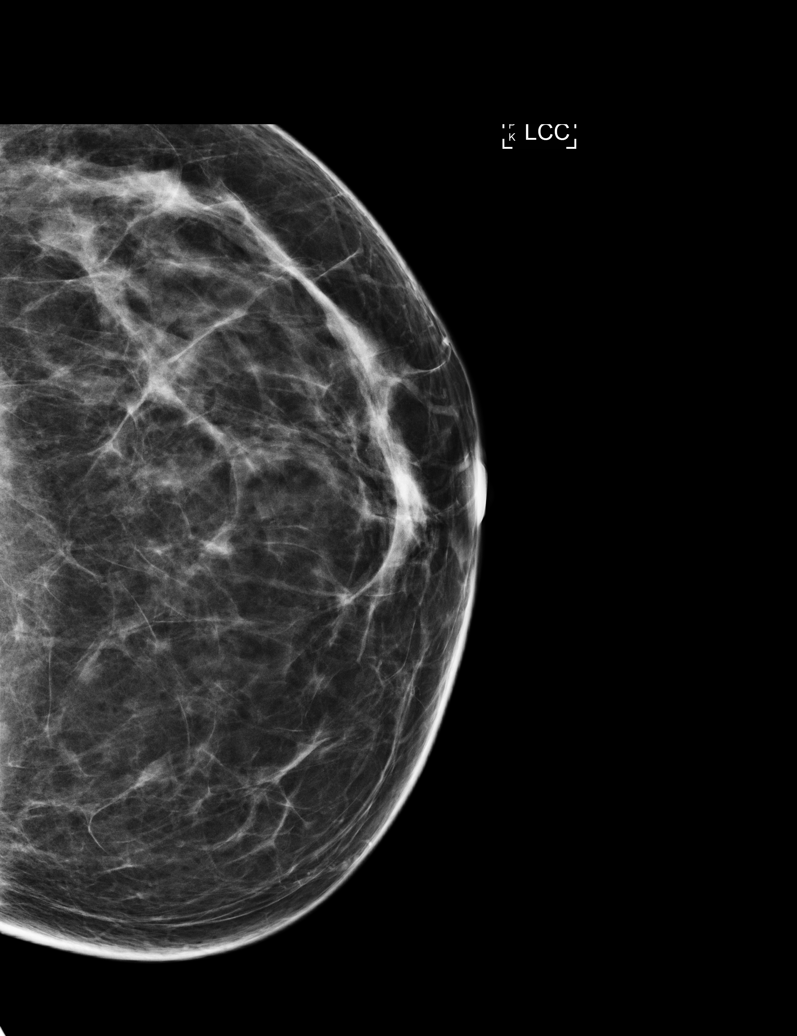

[R MLO]
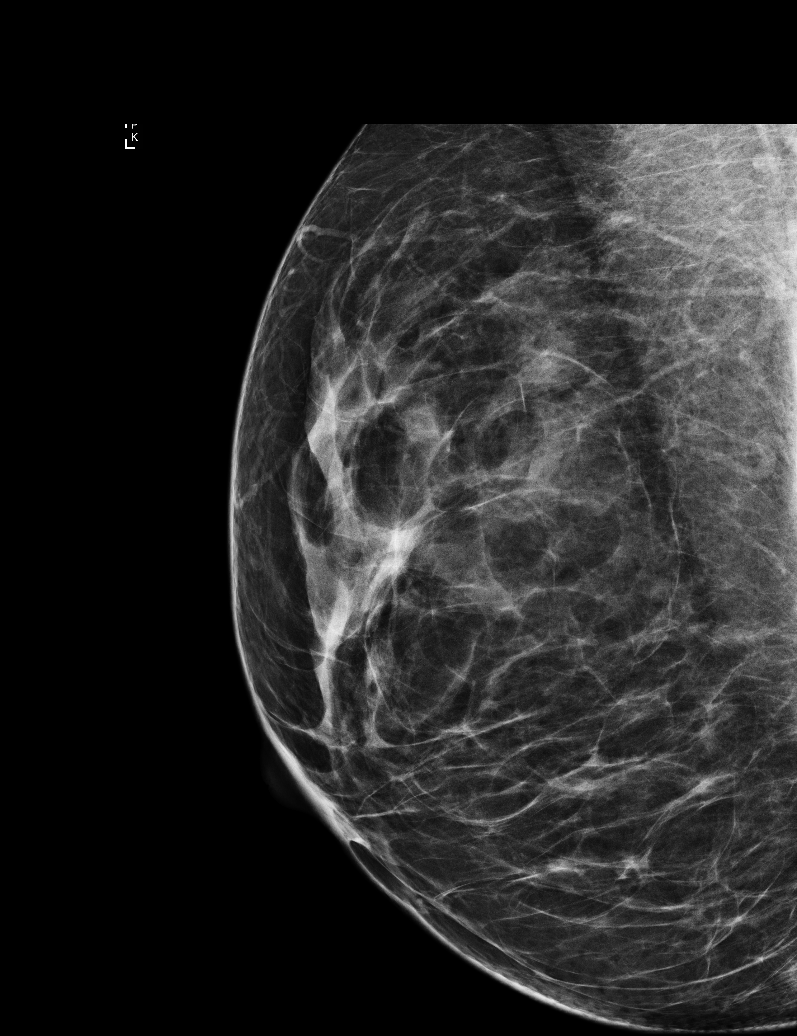

[L MLO (2 of 2)]
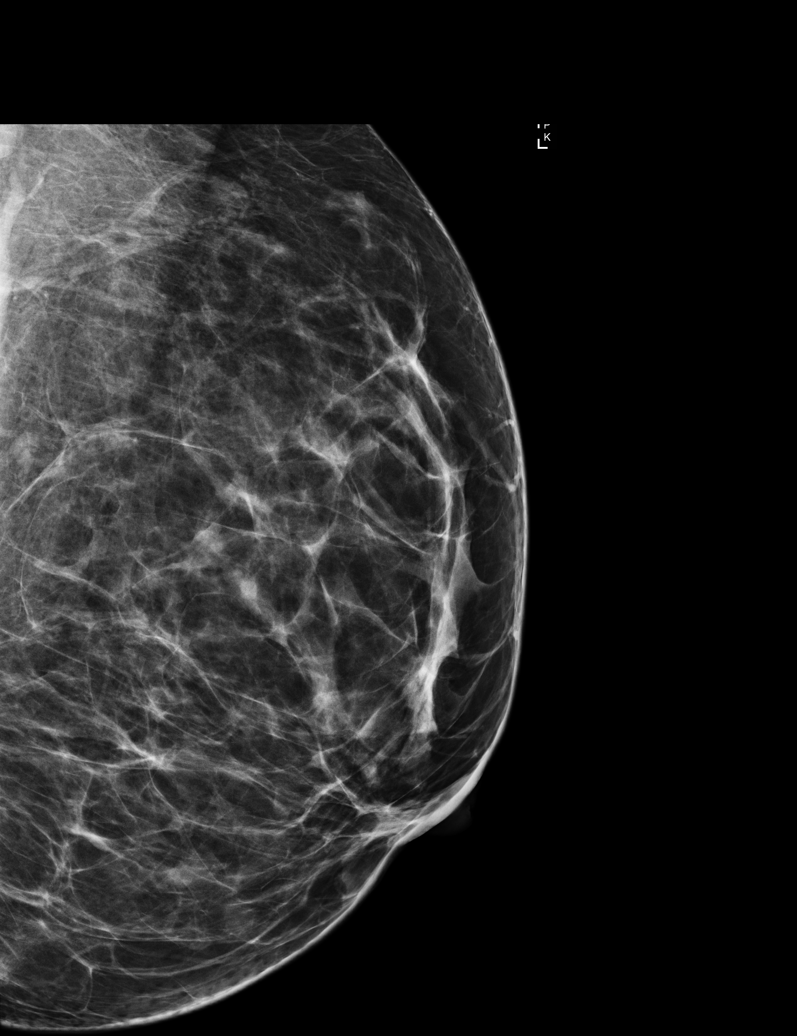

[5 of 5 positions shown; findings below may reference images not displayed]

ACR Breast Density Category c: The breast tissue is heterogeneously
dense, which may obscure small masses.
FINDINGS: There are no findings suspicious for malignancy. Images were
processed with CAD.
IMPRESSION: No mammographic evidence of malignancy. A result letter of this
screening mammogram will be mailed directly to the patient.

RECOMMENDATION:
Screening mammogram at age 41. (Code:BN-I-ALL)

BI-RADS CATEGORY  1: Negative.

## 2019-08-17 MED FILL — AMOX-CLAV 875-125 MG TABLET: 875-125 | 7 days supply | Qty: 14 | Fill #0

## 2019-08-17 MED FILL — NP THYROID 60 MG TABLET: 60 | 30 days supply | Qty: 30 | Fill #1

## 2019-08-31 MED FILL — AMOXICILLIN 500 MG CAPSULE: 500 | 7 days supply | Qty: 21 | Fill #0

## 2019-09-18 MED FILL — NP THYROID 60 MG TABLET: 60 | 30 days supply | Qty: 30 | Fill #2

## 2019-10-19 MED FILL — NP THYROID 60 MG TABLET: 60 | 30 days supply | Qty: 30 | Fill #3

## 2019-11-16 MED FILL — NP THYROID 60 MG TABLET: 60 | 30 days supply | Qty: 30 | Fill #4

## 2019-12-16 MED FILL — NP THYROID 60 MG TABLET: 60 | 30 days supply | Qty: 30 | Fill #5

## 2020-01-17 MED FILL — NP THYROID 60 MG TABLET: 60 | 30 days supply | Qty: 30 | Fill #6

## 2020-02-15 MED FILL — NP THYROID 60 MG TABLET: 60 | 30 days supply | Qty: 30 | Fill #7

## 2020-03-14 MED FILL — NP THYROID 60 MG TABLET: 60 | 30 days supply | Qty: 30 | Fill #8

## 2020-04-15 ENCOUNTER — Other Ambulatory Visit (HOSPITAL_COMMUNITY): Payer: Self-pay | Admitting: Family Medicine

## 2020-04-16 ENCOUNTER — Other Ambulatory Visit (HOSPITAL_COMMUNITY): Payer: Self-pay

## 2020-04-16 MED FILL — Thyroid Tab 60 MG (1 Grain): ORAL | 30 days supply | Qty: 30 | Fill #0 | Status: AC

## 2020-04-17 ENCOUNTER — Other Ambulatory Visit (HOSPITAL_COMMUNITY): Payer: Self-pay

## 2020-04-18 ENCOUNTER — Other Ambulatory Visit (HOSPITAL_COMMUNITY): Payer: Self-pay

## 2020-04-19 ENCOUNTER — Other Ambulatory Visit (HOSPITAL_COMMUNITY): Payer: Self-pay

## 2020-05-10 ENCOUNTER — Other Ambulatory Visit: Payer: Self-pay | Admitting: Family Medicine

## 2020-05-10 DIAGNOSIS — R928 Other abnormal and inconclusive findings on diagnostic imaging of breast: Secondary | ICD-10-CM

## 2020-05-16 ENCOUNTER — Other Ambulatory Visit (HOSPITAL_COMMUNITY): Payer: Self-pay

## 2020-05-16 MED FILL — Thyroid Tab 60 MG (1 Grain): ORAL | 30 days supply | Qty: 30 | Fill #1 | Status: AC

## 2020-06-14 ENCOUNTER — Other Ambulatory Visit (HOSPITAL_COMMUNITY): Payer: Self-pay

## 2020-06-14 MED FILL — Thyroid Tab 60 MG (1 Grain): ORAL | 30 days supply | Qty: 30 | Fill #2 | Status: AC

## 2020-07-01 ENCOUNTER — Other Ambulatory Visit: Payer: Self-pay | Admitting: Family Medicine

## 2020-07-01 ENCOUNTER — Ambulatory Visit
Admission: RE | Admit: 2020-07-01 | Discharge: 2020-07-01 | Disposition: A | Discharge status: Home / Self Care | Payer: No Typology Code available for payment source | Source: Ambulatory Visit | Attending: Family Medicine | Admitting: Family Medicine

## 2020-07-01 ENCOUNTER — Other Ambulatory Visit: Payer: Self-pay

## 2020-07-01 DIAGNOSIS — R928 Other abnormal and inconclusive findings on diagnostic imaging of breast: Secondary | ICD-10-CM

## 2020-08-10 ENCOUNTER — Other Ambulatory Visit (HOSPITAL_COMMUNITY): Payer: Self-pay

## 2020-08-10 MED ORDER — THYROID 60 MG PO TABS
ORAL_TABLET | ORAL | 3 refills | Status: DC
  Filled 2020-08-10: qty 60, 30d supply, fill #0
  Filled 2020-09-14: qty 60, 30d supply, fill #1
  Filled 2020-10-13: qty 60, 30d supply, fill #2
  Filled 2020-11-14: qty 60, 30d supply, fill #3

## 2020-09-14 ENCOUNTER — Other Ambulatory Visit (HOSPITAL_COMMUNITY): Payer: Self-pay

## 2020-10-04 ENCOUNTER — Other Ambulatory Visit (HOSPITAL_COMMUNITY): Payer: Self-pay

## 2020-10-04 MED ORDER — METHYLPREDNISOLONE 8 MG PO TABS
8.0000 mg | ORAL_TABLET | Freq: Every day | ORAL | 0 refills | Status: AC
  Filled 2020-10-04: qty 5, 5d supply, fill #0

## 2020-10-04 MED ORDER — FOLIC ACID 1 MG PO TABS
1.0000 mg | ORAL_TABLET | Freq: Every day | ORAL | 0 refills | Status: AC
  Filled 2020-10-04: qty 5, 5d supply, fill #0

## 2020-10-04 MED ORDER — HYDROXYUREA 500 MG PO CAPS
1000.0000 mg | ORAL_CAPSULE | Freq: Every day | ORAL | 0 refills | Status: AC
  Filled 2020-10-04: qty 10, 5d supply, fill #0

## 2020-10-13 ENCOUNTER — Other Ambulatory Visit (HOSPITAL_COMMUNITY): Payer: Self-pay

## 2020-11-14 ENCOUNTER — Other Ambulatory Visit (HOSPITAL_COMMUNITY): Payer: Self-pay

## 2020-11-30 ENCOUNTER — Other Ambulatory Visit (HOSPITAL_COMMUNITY): Payer: Self-pay

## 2020-11-30 MED ORDER — AZITHROMYCIN 250 MG PO TABS
ORAL_TABLET | Freq: Two times a day (BID) | ORAL | 0 refills | Status: AC
  Filled 2020-11-30: qty 14, 7d supply, fill #0

## 2020-11-30 MED ORDER — HYDROCODONE BIT-HOMATROP MBR 5-1.5 MG/5ML PO SOLN
ORAL | 0 refills | Status: AC
  Filled 2020-11-30: qty 75, 5d supply, fill #0

## 2020-12-14 ENCOUNTER — Other Ambulatory Visit (HOSPITAL_COMMUNITY): Payer: Self-pay

## 2020-12-14 MED ORDER — THYROID 60 MG PO TABS
ORAL_TABLET | ORAL | 3 refills | Status: DC
  Filled 2020-12-14: qty 60, 30d supply, fill #0
  Filled 2021-01-13: qty 60, 30d supply, fill #1
  Filled 2021-02-13: qty 60, 30d supply, fill #2
  Filled 2021-03-15: qty 60, 30d supply, fill #3

## 2021-01-02 ENCOUNTER — Other Ambulatory Visit: Payer: No Typology Code available for payment source

## 2021-01-13 ENCOUNTER — Other Ambulatory Visit (HOSPITAL_COMMUNITY): Payer: Self-pay

## 2021-01-23 DIAGNOSIS — D693 Immune thrombocytopenic purpura: Secondary | ICD-10-CM | POA: Diagnosis not present

## 2021-01-23 DIAGNOSIS — N943 Premenstrual tension syndrome: Secondary | ICD-10-CM | POA: Diagnosis not present

## 2021-01-23 DIAGNOSIS — E039 Hypothyroidism, unspecified: Secondary | ICD-10-CM | POA: Diagnosis not present

## 2021-01-23 DIAGNOSIS — E063 Autoimmune thyroiditis: Secondary | ICD-10-CM | POA: Diagnosis not present

## 2021-02-13 ENCOUNTER — Other Ambulatory Visit (HOSPITAL_COMMUNITY): Payer: Self-pay

## 2021-03-15 ENCOUNTER — Other Ambulatory Visit (HOSPITAL_COMMUNITY): Payer: Self-pay

## 2021-04-14 ENCOUNTER — Other Ambulatory Visit (HOSPITAL_COMMUNITY): Payer: Self-pay

## 2021-04-14 MED ORDER — THYROID 60 MG PO TABS
ORAL_TABLET | ORAL | 3 refills | Status: DC
  Filled 2021-04-14: qty 60, 30d supply, fill #0
  Filled 2021-05-15: qty 60, 30d supply, fill #1
  Filled 2021-06-14: qty 60, 30d supply, fill #2
  Filled 2021-07-13: qty 60, 30d supply, fill #3

## 2021-04-15 ENCOUNTER — Other Ambulatory Visit (HOSPITAL_COMMUNITY): Payer: Self-pay

## 2021-05-15 ENCOUNTER — Other Ambulatory Visit (HOSPITAL_COMMUNITY): Payer: Self-pay

## 2021-06-14 ENCOUNTER — Other Ambulatory Visit (HOSPITAL_COMMUNITY): Payer: Self-pay

## 2021-07-05 DIAGNOSIS — N943 Premenstrual tension syndrome: Secondary | ICD-10-CM | POA: Diagnosis not present

## 2021-07-05 DIAGNOSIS — E559 Vitamin D deficiency, unspecified: Secondary | ICD-10-CM | POA: Diagnosis not present

## 2021-07-05 DIAGNOSIS — E063 Autoimmune thyroiditis: Secondary | ICD-10-CM | POA: Diagnosis not present

## 2021-07-05 DIAGNOSIS — E538 Deficiency of other specified B group vitamins: Secondary | ICD-10-CM | POA: Diagnosis not present

## 2021-07-05 DIAGNOSIS — E039 Hypothyroidism, unspecified: Secondary | ICD-10-CM | POA: Diagnosis not present

## 2021-07-05 DIAGNOSIS — L659 Nonscarring hair loss, unspecified: Secondary | ICD-10-CM | POA: Diagnosis not present

## 2021-07-13 ENCOUNTER — Other Ambulatory Visit (HOSPITAL_COMMUNITY): Payer: Self-pay

## 2021-08-14 ENCOUNTER — Other Ambulatory Visit (HOSPITAL_COMMUNITY): Payer: Self-pay

## 2021-08-14 MED ORDER — THYROID 60 MG PO TABS
120.0000 mg | ORAL_TABLET | Freq: Every day | ORAL | 3 refills | Status: DC
  Filled 2021-08-14: qty 60, 30d supply, fill #0
  Filled 2021-09-12: qty 60, 30d supply, fill #1
  Filled 2021-10-11: qty 60, 30d supply, fill #2
  Filled 2021-11-09: qty 60, 30d supply, fill #3

## 2021-09-12 ENCOUNTER — Other Ambulatory Visit (HOSPITAL_COMMUNITY): Payer: Self-pay

## 2021-09-22 ENCOUNTER — Other Ambulatory Visit (HOSPITAL_COMMUNITY): Payer: Self-pay

## 2021-09-22 MED ORDER — NITROFURANTOIN MACROCRYSTAL 100 MG PO CAPS
100.0000 mg | ORAL_CAPSULE | Freq: Two times a day (BID) | ORAL | 0 refills | Status: AC
  Filled 2021-09-22: qty 14, 7d supply, fill #0

## 2021-10-12 ENCOUNTER — Other Ambulatory Visit (HOSPITAL_COMMUNITY): Payer: Self-pay

## 2021-11-10 ENCOUNTER — Other Ambulatory Visit (HOSPITAL_COMMUNITY): Payer: Self-pay

## 2021-12-10 ENCOUNTER — Other Ambulatory Visit (HOSPITAL_COMMUNITY): Payer: Self-pay

## 2021-12-11 ENCOUNTER — Other Ambulatory Visit (HOSPITAL_COMMUNITY): Payer: Self-pay

## 2021-12-11 MED ORDER — THYROID 60 MG PO TABS
120.0000 mg | ORAL_TABLET | Freq: Every day | ORAL | 2 refills | Status: DC
  Filled 2021-12-11: qty 60, 30d supply, fill #0
  Filled 2022-01-09: qty 60, 30d supply, fill #1
  Filled 2022-02-07 – 2022-02-09 (×2): qty 60, 30d supply, fill #2

## 2021-12-21 ENCOUNTER — Other Ambulatory Visit (HOSPITAL_COMMUNITY): Payer: Self-pay

## 2021-12-21 MED ORDER — MUPIROCIN 2 % EX OINT
TOPICAL_OINTMENT | Freq: Two times a day (BID) | CUTANEOUS | 1 refills | Status: AC
  Filled 2021-12-21: qty 22, 30d supply, fill #0
  Filled 2021-12-21: qty 22, 7d supply, fill #0

## 2022-01-10 ENCOUNTER — Other Ambulatory Visit (HOSPITAL_COMMUNITY): Payer: Self-pay

## 2022-02-09 ENCOUNTER — Other Ambulatory Visit (HOSPITAL_COMMUNITY): Payer: Self-pay

## 2022-03-09 ENCOUNTER — Other Ambulatory Visit (HOSPITAL_COMMUNITY): Payer: Self-pay

## 2022-03-09 MED ORDER — THYROID 60 MG PO TABS
120.0000 mg | ORAL_TABLET | Freq: Every day | ORAL | 2 refills | Status: DC
  Filled 2022-03-09: qty 60, 30d supply, fill #0
  Filled 2022-04-06: qty 60, 30d supply, fill #1
  Filled 2022-05-08: qty 60, 30d supply, fill #2

## 2022-04-06 ENCOUNTER — Other Ambulatory Visit: Payer: Self-pay

## 2022-04-07 ENCOUNTER — Other Ambulatory Visit (HOSPITAL_COMMUNITY): Payer: Self-pay

## 2022-05-09 ENCOUNTER — Other Ambulatory Visit (HOSPITAL_COMMUNITY): Payer: Self-pay

## 2022-06-07 ENCOUNTER — Other Ambulatory Visit (HOSPITAL_COMMUNITY): Payer: Self-pay

## 2022-06-07 MED ORDER — THYROID 60 MG PO TABS
120.0000 mg | ORAL_TABLET | Freq: Every day | ORAL | 2 refills | Status: DC
  Filled 2022-06-07: qty 60, 30d supply, fill #0
  Filled 2022-07-09: qty 60, 30d supply, fill #1
  Filled 2022-08-06: qty 60, 30d supply, fill #2

## 2022-07-09 ENCOUNTER — Other Ambulatory Visit (HOSPITAL_COMMUNITY): Payer: Self-pay

## 2022-07-09 ENCOUNTER — Other Ambulatory Visit: Payer: Self-pay

## 2022-07-09 MED ORDER — THYROID 60 MG PO TABS
120.0000 mg | ORAL_TABLET | Freq: Every day | ORAL | 2 refills | Status: DC
  Filled 2022-07-09 – 2023-05-02 (×2): qty 180, 90d supply, fill #0

## 2022-07-16 ENCOUNTER — Other Ambulatory Visit (HOSPITAL_COMMUNITY): Payer: Self-pay

## 2022-07-16 MED ORDER — HYDROXYUREA 500 MG PO CAPS
500.0000 mg | ORAL_CAPSULE | Freq: Two times a day (BID) | ORAL | 0 refills | Status: AC
  Filled 2022-07-16: qty 10, 5d supply, fill #0

## 2022-07-16 MED ORDER — FOLIC ACID 1 MG PO TABS
1.0000 mg | ORAL_TABLET | Freq: Every day | ORAL | 0 refills | Status: AC
  Filled 2022-07-16: qty 5, 5d supply, fill #0

## 2022-07-16 MED ORDER — METHYLPREDNISOLONE 4 MG PO TABS
8.0000 mg | ORAL_TABLET | Freq: Every day | ORAL | 0 refills | Status: AC
  Filled 2022-07-16: qty 10, 5d supply, fill #0

## 2022-07-16 MED ORDER — METFORMIN HCL 500 MG PO TABS
500.0000 mg | ORAL_TABLET | Freq: Two times a day (BID) | ORAL | 0 refills | Status: DC
  Filled 2022-07-16: qty 60, 30d supply, fill #0

## 2022-08-07 ENCOUNTER — Other Ambulatory Visit (HOSPITAL_COMMUNITY): Payer: Self-pay

## 2022-08-14 ENCOUNTER — Other Ambulatory Visit (HOSPITAL_COMMUNITY): Payer: Self-pay

## 2022-08-14 MED ORDER — METFORMIN HCL 500 MG PO TABS
500.0000 mg | ORAL_TABLET | Freq: Two times a day (BID) | ORAL | 2 refills | Status: AC
  Filled 2022-08-14: qty 120, 60d supply, fill #0
  Filled 2022-10-04: qty 120, 60d supply, fill #1

## 2022-09-06 ENCOUNTER — Other Ambulatory Visit (HOSPITAL_COMMUNITY): Payer: Self-pay

## 2022-09-06 ENCOUNTER — Other Ambulatory Visit: Payer: Self-pay

## 2022-09-06 MED ORDER — THYROID 60 MG PO TABS
120.0000 mg | ORAL_TABLET | Freq: Every day | ORAL | 2 refills | Status: DC
  Filled 2022-09-06 – 2022-09-07 (×2): qty 60, 30d supply, fill #0
  Filled 2022-10-04: qty 60, 30d supply, fill #1
  Filled 2022-11-05: qty 60, 30d supply, fill #2

## 2022-09-07 ENCOUNTER — Encounter: Payer: Self-pay | Admitting: Pharmacist

## 2022-09-07 ENCOUNTER — Other Ambulatory Visit (HOSPITAL_COMMUNITY): Payer: Self-pay

## 2022-09-07 ENCOUNTER — Other Ambulatory Visit: Payer: Self-pay

## 2022-09-11 ENCOUNTER — Other Ambulatory Visit: Payer: Self-pay

## 2022-10-04 ENCOUNTER — Other Ambulatory Visit (HOSPITAL_COMMUNITY): Payer: Self-pay

## 2022-11-05 ENCOUNTER — Other Ambulatory Visit: Payer: Self-pay

## 2022-11-06 ENCOUNTER — Other Ambulatory Visit (HOSPITAL_COMMUNITY): Payer: Self-pay

## 2022-12-03 ENCOUNTER — Other Ambulatory Visit (HOSPITAL_COMMUNITY): Payer: Self-pay

## 2022-12-03 MED ORDER — THYROID 60 MG PO TABS
120.0000 mg | ORAL_TABLET | Freq: Every day | ORAL | 2 refills | Status: DC
  Filled 2022-12-03: qty 60, 30d supply, fill #0
  Filled 2023-01-02: qty 60, 30d supply, fill #1
  Filled 2023-01-31: qty 60, 30d supply, fill #2

## 2022-12-05 ENCOUNTER — Other Ambulatory Visit (HOSPITAL_COMMUNITY): Payer: Self-pay

## 2023-01-02 ENCOUNTER — Other Ambulatory Visit: Payer: Self-pay

## 2023-01-03 ENCOUNTER — Other Ambulatory Visit (HOSPITAL_COMMUNITY): Payer: Self-pay

## 2023-01-04 ENCOUNTER — Other Ambulatory Visit: Payer: Self-pay

## 2023-01-04 ENCOUNTER — Other Ambulatory Visit (HOSPITAL_COMMUNITY): Payer: Self-pay

## 2023-01-04 MED ORDER — METHYLPREDNISOLONE 8 MG PO TABS
8.0000 mg | ORAL_TABLET | Freq: Every day | ORAL | 0 refills | Status: AC
  Filled 2023-01-04: qty 5, 5d supply, fill #0

## 2023-01-04 MED ORDER — HYDROXYUREA 500 MG PO CAPS
500.0000 mg | ORAL_CAPSULE | Freq: Two times a day (BID) | ORAL | 0 refills | Status: AC
  Filled 2023-01-04: qty 10, 5d supply, fill #0

## 2023-01-04 MED ORDER — FOLIC ACID 1 MG PO TABS
1.0000 mg | ORAL_TABLET | Freq: Every day | ORAL | 0 refills | Status: AC
  Filled 2023-01-04: qty 5, 5d supply, fill #0

## 2023-01-04 MED ORDER — METFORMIN HCL 500 MG PO TABS
500.0000 mg | ORAL_TABLET | Freq: Two times a day (BID) | ORAL | 0 refills | Status: AC
  Filled 2023-01-04: qty 60, 30d supply, fill #0

## 2023-01-31 ENCOUNTER — Other Ambulatory Visit: Payer: Self-pay

## 2023-03-04 ENCOUNTER — Other Ambulatory Visit: Payer: Self-pay

## 2023-03-04 ENCOUNTER — Other Ambulatory Visit (HOSPITAL_COMMUNITY): Payer: Self-pay

## 2023-03-04 MED ORDER — THYROID 60 MG PO TABS
120.0000 mg | ORAL_TABLET | Freq: Every day | ORAL | 2 refills | Status: AC
  Filled 2023-03-04: qty 60, 30d supply, fill #0
  Filled 2023-04-03: qty 60, 30d supply, fill #1

## 2023-03-16 ENCOUNTER — Other Ambulatory Visit: Payer: Self-pay | Admitting: Medical Genetics

## 2023-03-29 ENCOUNTER — Other Ambulatory Visit: Payer: Self-pay

## 2023-03-29 DIAGNOSIS — Z006 Encounter for examination for normal comparison and control in clinical research program: Secondary | ICD-10-CM

## 2023-04-04 ENCOUNTER — Other Ambulatory Visit (HOSPITAL_COMMUNITY): Payer: Self-pay

## 2023-05-03 ENCOUNTER — Other Ambulatory Visit (HOSPITAL_COMMUNITY): Payer: Self-pay

## 2023-05-28 LAB — GENECONNECT MOLECULAR SCREEN: Genetic Analysis Overall Interpretation: POSITIVE — AB

## 2023-05-29 ENCOUNTER — Telehealth: Payer: Self-pay | Admitting: Medical Genetics

## 2023-05-29 DIAGNOSIS — Z1501 Genetic susceptibility to malignant neoplasm of breast: Secondary | ICD-10-CM

## 2023-05-29 NOTE — Telephone Encounter (Addendum)
 [facility] Positive Result Note 05/29/2023 7:40 PM  FIRST ATTEMPT: Confirmed I was speaking with Terri Fry [id number] by using name and DOB. Informed participant the reason for this call is to provide results for the above study. Results revealed Hereditary Breast and Ovarian Syndrome. Genetic counseling was offered and participant requests a call back to schedule. All questions were answered, and participant was thanked for their time and support of the above study. Participant was encouraged to contact [facility] if they have any further questions or concerns.

## 2023-05-30 ENCOUNTER — Telehealth: Payer: Self-pay | Admitting: Medical Genetics

## 2023-06-06 NOTE — Telephone Encounter (Signed)
 MyChart message sent 06/06/2023

## 2023-06-12 ENCOUNTER — Ambulatory Visit: Payer: Self-pay

## 2023-06-12 NOTE — Telephone Encounter (Signed)
 [facility] 06/12/2023 9:03 AM  Attempted to contact participant 3 times to schedule genetic counseling appointment. Left voicemail's and a MyChart message with contact info for the [facility] research team. Participant was encouraged to contact [facility] if they have any further questions or concerns.    Jordyn Pennstrom, BS [place]  [facility] Clinical Research Specialist II Direct Dial: [phone]  Fax: [phone]

## 2023-07-29 ENCOUNTER — Other Ambulatory Visit (HOSPITAL_COMMUNITY): Payer: Self-pay

## 2023-07-29 MED ORDER — THYROID 60 MG PO TABS
120.0000 mg | ORAL_TABLET | Freq: Every day | ORAL | 2 refills | Status: AC
  Filled 2023-07-29: qty 180, 90d supply, fill #0

## 2023-07-30 ENCOUNTER — Other Ambulatory Visit: Payer: Self-pay

## 2023-07-30 ENCOUNTER — Encounter: Payer: Self-pay | Admitting: Pharmacist

## 2023-07-30 ENCOUNTER — Other Ambulatory Visit (HOSPITAL_COMMUNITY): Payer: Self-pay

## 2023-09-11 ENCOUNTER — Other Ambulatory Visit (HOSPITAL_COMMUNITY): Payer: Self-pay

## 2023-09-12 DIAGNOSIS — F4323 Adjustment disorder with mixed anxiety and depressed mood: Secondary | ICD-10-CM | POA: Diagnosis not present

## 2023-09-30 DIAGNOSIS — F4323 Adjustment disorder with mixed anxiety and depressed mood: Secondary | ICD-10-CM | POA: Diagnosis not present

## 2023-10-29 ENCOUNTER — Other Ambulatory Visit (HOSPITAL_COMMUNITY): Payer: Self-pay

## 2023-11-29 DIAGNOSIS — N959 Unspecified menopausal and perimenopausal disorder: Secondary | ICD-10-CM | POA: Diagnosis not present

## 2023-11-29 DIAGNOSIS — E559 Vitamin D deficiency, unspecified: Secondary | ICD-10-CM | POA: Diagnosis not present

## 2023-11-29 DIAGNOSIS — Z0001 Encounter for general adult medical examination with abnormal findings: Secondary | ICD-10-CM | POA: Diagnosis not present

## 2023-11-29 DIAGNOSIS — E063 Autoimmune thyroiditis: Secondary | ICD-10-CM | POA: Diagnosis not present

## 2023-11-29 DIAGNOSIS — D693 Immune thrombocytopenic purpura: Secondary | ICD-10-CM | POA: Diagnosis not present

## 2023-11-29 DIAGNOSIS — R790 Abnormal level of blood mineral: Secondary | ICD-10-CM | POA: Diagnosis not present

## 2023-12-16 DIAGNOSIS — M2550 Pain in unspecified joint: Secondary | ICD-10-CM | POA: Diagnosis not present

## 2023-12-16 DIAGNOSIS — R5383 Other fatigue: Secondary | ICD-10-CM | POA: Diagnosis not present

## 2023-12-16 DIAGNOSIS — A6920 Lyme disease, unspecified: Secondary | ICD-10-CM | POA: Diagnosis not present

## 2023-12-16 DIAGNOSIS — Z0001 Encounter for general adult medical examination with abnormal findings: Secondary | ICD-10-CM | POA: Diagnosis not present

## 2023-12-16 DIAGNOSIS — E538 Deficiency of other specified B group vitamins: Secondary | ICD-10-CM | POA: Diagnosis not present

## 2023-12-16 DIAGNOSIS — R519 Headache, unspecified: Secondary | ICD-10-CM | POA: Diagnosis not present

## 2023-12-16 DIAGNOSIS — W57XXXA Bitten or stung by nonvenomous insect and other nonvenomous arthropods, initial encounter: Secondary | ICD-10-CM | POA: Diagnosis not present

## 2023-12-16 DIAGNOSIS — E559 Vitamin D deficiency, unspecified: Secondary | ICD-10-CM | POA: Diagnosis not present

## 2023-12-16 DIAGNOSIS — Z1501 Genetic susceptibility to malignant neoplasm of breast: Secondary | ICD-10-CM | POA: Diagnosis not present

## 2023-12-16 DIAGNOSIS — E039 Hypothyroidism, unspecified: Secondary | ICD-10-CM | POA: Diagnosis not present
